# Patient Record
Sex: Male | Born: 1985
Health system: Southern US, Community
[De-identification: ages and names within clinical notes are randomized; demographics above are authoritative.]

## PROBLEM LIST (undated history)

## (undated) DIAGNOSIS — B360 Pityriasis versicolor: Secondary | ICD-10-CM

## (undated) DIAGNOSIS — R7303 Prediabetes: Secondary | ICD-10-CM

## (undated) DIAGNOSIS — K760 Fatty (change of) liver, not elsewhere classified: Secondary | ICD-10-CM

## (undated) DIAGNOSIS — E66813 Obesity, class 3: Secondary | ICD-10-CM

## (undated) DIAGNOSIS — L219 Seborrheic dermatitis, unspecified: Secondary | ICD-10-CM

## (undated) DIAGNOSIS — R748 Abnormal levels of other serum enzymes: Secondary | ICD-10-CM

## (undated) HISTORY — DX: Prediabetes: R73.03

## (undated) HISTORY — DX: Pityriasis versicolor: B36.0

## (undated) HISTORY — DX: Seborrheic dermatitis, unspecified: L21.9

## (undated) HISTORY — DX: Morbid (severe) obesity due to excess calories: E66.01

## (undated) HISTORY — DX: Obesity, class 3: E66.813

## (undated) HISTORY — DX: Abnormal levels of other serum enzymes: R74.8

## (undated) HISTORY — DX: Fatty (change of) liver, not elsewhere classified: K76.0

---

## 2010-03-18 HISTORY — PX: TOENAIL EXCISION: SUR558

## 2017-05-03 ENCOUNTER — Encounter: Payer: Self-pay | Admitting: Internal Medicine

## 2019-06-09 ENCOUNTER — Other Ambulatory Visit: Payer: Self-pay

## 2019-06-10 ENCOUNTER — Encounter: Payer: Self-pay | Admitting: Family Medicine

## 2019-06-10 ENCOUNTER — Ambulatory Visit: Payer: 59 | Admitting: Family Medicine

## 2019-06-10 VITALS — BP 129/82 | HR 95 | Temp 98.4°F | Resp 16 | Ht 65.5 in | Wt 272.0 lb

## 2019-06-10 DIAGNOSIS — Z0000 Encounter for general adult medical examination without abnormal findings: Secondary | ICD-10-CM | POA: Diagnosis not present

## 2019-06-10 DIAGNOSIS — Z131 Encounter for screening for diabetes mellitus: Secondary | ICD-10-CM

## 2019-06-10 DIAGNOSIS — R42 Dizziness and giddiness: Secondary | ICD-10-CM | POA: Diagnosis not present

## 2019-06-10 DIAGNOSIS — R7989 Other specified abnormal findings of blood chemistry: Secondary | ICD-10-CM | POA: Diagnosis not present

## 2019-06-10 DIAGNOSIS — Z13220 Encounter for screening for lipoid disorders: Secondary | ICD-10-CM

## 2019-06-10 NOTE — Progress Notes (Signed)
Office Note 06/10/2019  CC:  Chief Complaint  Patient presents with  . Establish Care    No recent/ Previous PCP, just moved to [place] in June   HPI:  Randall Chase is a 34 y.o.  male who is here to establish care Patient's most recent primary MD: none in the last 10 yrs. Old records were not reviewed prior to or during today's visit.  Says blood testing about 2 yrs ago was very elevated, started donating blood several times a year until the last 1 yr. Just donated last month.  Hemachromatosis was appropriately brought up by his MD,but pt did not f/u b/c of lack of insurance. No known FH of hemachromatosis. NO redness of face or neck.  No HAs.  About 2 wks ago he felt a brief period of dizziness and had acute craving for something to eat and drink.  No palpitations.  He did eat and drink and felt improved.  Duration of episode about 20 min.  No nausea.  Had not gone a long time since eating. He did have to pee.  No episode since that time.   Since then has been limiting sugar and coffee/caffeine. Some chronic nighttime inc frequency but o/w no polyuria.  No polydipsia. Eats regular meals.   Past Medical History:  Diagnosis Date  . Obesity, Class III, BMI 40-49.9 (morbid obesity) (HCC)   . Seborrheic dermatitis of scalp   . Tinea versicolor     Past Surgical History:  Procedure Laterality Date  . TOENAIL EXCISION  2012   Ingrown    Family History  Problem Relation Age of Onset  . High Cholesterol Mother   . Depression Father   . Diabetes Father   . Diabetes Maternal Grandmother   . Diabetes Maternal Grandfather   . Alcohol abuse Paternal Grandmother   . Diabetes Paternal Grandmother   . Diabetes Paternal Grandfather     Social History   Socioeconomic History  . Marital status: Single    Spouse name: Not on file  . Number of children: Not on file  . Years of education: Not on file  . Highest education level: Not on file  Occupational History  . Not on  file  Tobacco Use  . Smoking status: Never Smoker  . Smokeless tobacco: Never Used  Substance and Sexual Activity  . Alcohol use: Never  . Drug use: Never  . Sexual activity: Yes    Partners: Female    Birth control/protection: None  Other Topics Concern  . Not on file  Social History Narrative   Longtime girlfriend, 2 y/o daughter.   Relocated to [place] from [place], [place] [address].  Orig from [place].   College: Assoc degree in [place].   Occup: Data entry, asset recovery.   No T/A/Ds   Social Determinants of Health   Financial Resource Strain:   . Difficulty of Paying Living Expenses:   Food Insecurity:   . Worried About Programme researcher, broadcasting/film/video in the Last Year:   . Barista in the Last Year:   Transportation Needs:   . Freight forwarder (Medical):   Marland Kitchen Lack of Transportation (Non-Medical):   Physical Activity:   . Days of Exercise per Week:   . Minutes of Exercise per Session:   Stress:   . Feeling of Stress :   Social Connections:   . Frequency of Communication with Friends and Family:   . Frequency of Social Gatherings with Friends and Family:   .  Attends Religious Services:   . Active Member of Clubs or Organizations:   . Attends Archivist Meetings:   Marland Kitchen Marital Status:   Intimate Partner Violence:   . Fear of Current or Ex-Partner:   . Emotionally Abused:   Marland Kitchen Physically Abused:   . Sexually Abused:     Outpatient Encounter Medications as of 06/10/2019  Medication Sig  . COCONUT OIL PO Take by mouth every other day.  . ketoconazole (NIZORAL) 2 % shampoo USE 3 TIMES PER WEEK ALTERNATING WITH OVER THE COUNTER DANDRUFF SHAMPOO  . nystatin cream (MYCOSTATIN) APPLY TO AFFECTED AREA TWICE A DAY ON FACE AS NEEDED**MIX EQUAL PARTS WITH TRIAMCINOLONE CREAM**  . OREGANO PO Take by mouth every other day.  . triamcinolone cream (KENALOG) 0.1 % APPLY TO AFFECTED AREA ON FACE TWICE A DAY AS NEEDED **MIXED IN EQUAL PARTS WITH NYSTATIN**  . [DISCONTINUED]  fluconazole (DIFLUCAN) 200 MG tablet Take 200 mg by mouth every other day.   No facility-administered encounter medications on file as of 06/10/2019.    No Known Allergies  ROS Review of Systems  Constitutional: Negative for appetite change, chills, fatigue and fever.  HENT: Negative for congestion, dental problem, ear pain and sore throat.   Eyes: Negative for discharge, redness and visual disturbance.  Respiratory: Negative for cough, chest tightness, shortness of breath and wheezing.   Cardiovascular: Negative for chest pain, palpitations and leg swelling.  Gastrointestinal: Negative for abdominal pain, blood in stool, diarrhea, nausea and vomiting.  Endocrine: Negative for cold intolerance, heat intolerance, polydipsia, polyphagia and polyuria.  Genitourinary: Negative for difficulty urinating, dysuria, flank pain, frequency, hematuria and urgency.  Musculoskeletal: Negative for arthralgias, back pain, joint swelling, myalgias and neck stiffness.  Skin: Negative for pallor and rash.  Neurological: Positive for dizziness (one episode). Negative for speech difficulty, weakness and headaches.  Hematological: Negative for adenopathy. Does not bruise/bleed easily.  Psychiatric/Behavioral: Negative for confusion and sleep disturbance. The patient is not nervous/anxious.    PE; Blood pressure 129/82, pulse 95, temperature 98.4 F (36.9 C), temperature source Temporal, resp. rate 16, height 5' 5.5" (1.664 m), weight 272 lb (123.4 kg), SpO2 98 %. Gen: Alert, well appearing.  Patient is oriented to person, place, time, and situation. AFFECT: pleasant, lucid thought and speech. ENT: Ears: EACs clear, normal epithelium.  TMs with good light reflex and landmarks bilaterally.  Eyes: no injection, icteris, swelling, or exudate.  EOMI, PERRLA. Nose: no drainage or turbinate edema/swelling.  No injection or focal lesion.  Mouth: lips without lesion/swelling.  Oral mucosa pink and moist.  Dentition  intact and without obvious caries or gingival swelling.  Oropharynx without erythema, exudate, or swelling.  Neck: supple/nontender.  No LAD, mass, or TM.  Carotid pulses 2+ bilaterally, without bruits. CV: RRR, no m/r/g.   LUNGS: CTA bilat, nonlabored resps, good aeration in all lung fields. ABD: soft, NT, ND, BS normal.  No hepatospenomegaly or mass.  No bruits. EXT: no clubbing, cyanosis, or edema.  Musculoskeletal: no joint swelling, erythema, warmth, or tenderness.  ROM of all joints intact. Skin - no sores or suspicious lesions or rashes or color changes  Pertinent labs:  none  ASSESSMENT AND PLAN:   New pt; no old records available.  1) Hx of elevated ferritin; suspicion of hemachromatosis. He has been donating blood regularly but fell out of habit during 2020 covid crisis.  But most recent blood donation was last month.  Recheck CBC, iron panel today. May need referral to hem-onc.  2) Dizziness: isolated brief episode of what sounds like nonspecific panic attack sx's. No red flags for neurologic, cardiovascular, pulmonary, or endocrine etiology.  3) Health maintenance exam: Reviewed age and gender appropriate health maintenance issues (prudent diet, regular exercise, health risks of tobacco and excessive alcohol, use of seatbelts, fire alarms in home, use of sunscreen).  Also reviewed age and gender appropriate health screening as well as vaccine recommendations. Vaccines: Labs: fasting labs ordered --future FLP, CMET, TSH, CBC, iron panel FLP-screening for hyperlipidemia, obesity, dizziness, screening for DM 2). No vaccines today.   An After Visit Summary was printed and given to the patient.  Return for to be determined based on results of work up.  Signed:  Santiago Bumpers, MD           06/10/2019

## 2019-06-11 ENCOUNTER — Other Ambulatory Visit: Payer: Self-pay

## 2019-06-11 ENCOUNTER — Ambulatory Visit (INDEPENDENT_AMBULATORY_CARE_PROVIDER_SITE_OTHER): Payer: 59 | Admitting: Family Medicine

## 2019-06-11 DIAGNOSIS — R42 Dizziness and giddiness: Secondary | ICD-10-CM | POA: Diagnosis not present

## 2019-06-11 DIAGNOSIS — R7989 Other specified abnormal findings of blood chemistry: Secondary | ICD-10-CM | POA: Diagnosis not present

## 2019-06-11 DIAGNOSIS — Z13220 Encounter for screening for lipoid disorders: Secondary | ICD-10-CM

## 2019-06-11 DIAGNOSIS — Z131 Encounter for screening for diabetes mellitus: Secondary | ICD-10-CM | POA: Diagnosis not present

## 2019-06-11 LAB — CBC WITH DIFFERENTIAL/PLATELET
Basophils Absolute: 0 10*3/uL (ref 0.0–0.1)
Basophils Relative: 0.6 % (ref 0.0–3.0)
Eosinophils Absolute: 0.6 10*3/uL (ref 0.0–0.7)
Eosinophils Relative: 7.5 % — ABNORMAL HIGH (ref 0.0–5.0)
HCT: 44 % (ref 39.0–52.0)
Hemoglobin: 15.2 g/dL (ref 13.0–17.0)
Lymphocytes Relative: 50.1 % — ABNORMAL HIGH (ref 12.0–46.0)
Lymphs Abs: 4 10*3/uL (ref 0.7–4.0)
MCHC: 34.5 g/dL (ref 30.0–36.0)
MCV: 92.1 fl (ref 78.0–100.0)
Monocytes Absolute: 0.5 10*3/uL (ref 0.1–1.0)
Monocytes Relative: 6.8 % (ref 3.0–12.0)
Neutro Abs: 2.8 10*3/uL (ref 1.4–7.7)
Neutrophils Relative %: 35 % — ABNORMAL LOW (ref 43.0–77.0)
Platelets: 330 10*3/uL (ref 150.0–400.0)
RBC: 4.78 Mil/uL (ref 4.22–5.81)
RDW: 13.2 % (ref 11.5–15.5)
WBC: 7.9 10*3/uL (ref 4.0–10.5)

## 2019-06-11 LAB — COMPREHENSIVE METABOLIC PANEL
ALT: 75 U/L — ABNORMAL HIGH (ref 0–53)
AST: 38 U/L — ABNORMAL HIGH (ref 0–37)
Albumin: 4.3 g/dL (ref 3.5–5.2)
Alkaline Phosphatase: 93 U/L (ref 39–117)
BUN: 12 mg/dL (ref 6–23)
CO2: 26 mEq/L (ref 19–32)
Calcium: 9.4 mg/dL (ref 8.4–10.5)
Chloride: 103 mEq/L (ref 96–112)
Creatinine, Ser: 0.85 mg/dL (ref 0.40–1.50)
GFR: 103.15 mL/min (ref >60.00)
Glucose, Bld: 110 mg/dL — ABNORMAL HIGH (ref 70–99)
Potassium: 3.9 mEq/L (ref 3.5–5.1)
Sodium: 137 mEq/L (ref 135–145)
Total Bilirubin: 0.5 mg/dL (ref 0.2–1.2)
Total Protein: 7.6 g/dL (ref 6.0–8.3)

## 2019-06-11 LAB — LDL CHOLESTEROL, DIRECT: Direct LDL: 115 mg/dL

## 2019-06-11 LAB — LIPID PANEL
Cholesterol: 174 mg/dL (ref 0–200)
HDL: 31.9 mg/dL — ABNORMAL LOW (ref >39.00)
NonHDL: 141.65
Total CHOL/HDL Ratio: 5
Triglycerides: 235 mg/dL — ABNORMAL HIGH (ref 0.0–149.0)
VLDL: 47 mg/dL — ABNORMAL HIGH (ref 0.0–40.0)

## 2019-06-11 LAB — TSH: TSH: 3.53 u[IU]/mL (ref 0.35–4.50)

## 2019-06-12 LAB — IRON,TIBC AND FERRITIN PANEL
%SAT: 16 % (calc) — ABNORMAL LOW (ref 20–48)
Ferritin: 59 ng/mL (ref 38–380)
Iron: 55 ug/dL (ref 50–180)
TIBC: 340 mcg/dL (calc) (ref 250–425)

## 2019-06-15 ENCOUNTER — Other Ambulatory Visit (INDEPENDENT_AMBULATORY_CARE_PROVIDER_SITE_OTHER): Payer: 59

## 2019-06-15 ENCOUNTER — Other Ambulatory Visit: Payer: Self-pay | Admitting: Family Medicine

## 2019-06-15 DIAGNOSIS — R7301 Impaired fasting glucose: Secondary | ICD-10-CM

## 2019-06-15 DIAGNOSIS — R7401 Elevation of levels of liver transaminase levels: Secondary | ICD-10-CM

## 2019-06-15 LAB — HEMOGLOBIN A1C: Hgb A1c MFr Bld: 5.5 % (ref 4.6–6.5)

## 2019-06-16 LAB — HEPATITIS C ANTIBODY
Hepatitis C Ab: NONREACTIVE
SIGNAL TO CUT-OFF: 0.03 (ref <1.00)

## 2019-06-16 LAB — HEPATITIS B SURFACE ANTIGEN: Hepatitis B Surface Ag: NONREACTIVE

## 2019-06-17 ENCOUNTER — Other Ambulatory Visit: Payer: Self-pay

## 2019-06-17 DIAGNOSIS — D751 Secondary polycythemia: Secondary | ICD-10-CM

## 2019-06-18 ENCOUNTER — Telehealth: Payer: Self-pay | Admitting: Hematology

## 2019-06-18 NOTE — Telephone Encounter (Signed)
Received a new hem referral from Dr. Milinda Cave for erythrocytosis. Pt has been cld and scheduled to see Dr. Candise Che on 4/20 at 1pm. Pt aware to arrive 15 minutes early.

## 2019-07-02 ENCOUNTER — Telehealth: Payer: Self-pay | Admitting: Adult Health

## 2019-07-02 NOTE — Telephone Encounter (Signed)
Pt has been rescheduled to see Lillard Anes on 4/28 at 830am w/labs at 8am. Pt aware to arrive 15 minutes early.

## 2019-07-06 ENCOUNTER — Inpatient Hospital Stay: Payer: 59 | Admitting: Hematology

## 2019-07-06 ENCOUNTER — Inpatient Hospital Stay: Payer: 59

## 2019-07-13 NOTE — Progress Notes (Addendum)
Randall Chase  [phone] [phone]     ID: Randall Chase DOB: Dec 05, 1985  MR#: [id number]  FXT#:[id number]  Patient Care Team: Tammi Sou, MD as PCP - General (Family Medicine) Yvone Neu, MD as Consulting Physician (Dermatology) Scot Dock, NP OTHER MD:  CHIEF COMPLAINT: history elevated hemoglobin  CURRENT TREATMENT: undergoing evaluation   HISTORY OF CURRENT ILLNESS: Randall Chase tells [place] that his history began a couple of years ago when he was living in [place], [place].  He had a hemoglobin that was in the 20s and he saw a nutritionist, he remembers being told his iron was high as well.  He was recommended to donate blood regularly and he has been about every 3 months.  He has no records of this visit or lab values, however he notes that when he established care with Dr. Anitra Lauth he brought it up to him, and was recommended a hematology evaluation.  He has a slight elevation in his liver enzymes as well.  His recent hemoglobin and iron studies have remained normal.    Results for Randall Chase (MRN [id number]) as of 07/15/2019 09:34  Ref. Range 06/11/2019 08:36 07/14/2019 07:59  Hemoglobin Latest Ref Range: 13.0 - 17.0 g/dL 15.2 15.0  Results for Randall Chase (MRN [id number]) as of 07/15/2019 09:34  Ref. Range 06/11/2019 08:36 07/14/2019 07:59  Ferritin Latest Ref Range: 24 - 336 ng/mL 59 83    The patient's subsequent history is as detailed below.  INTERVAL HISTORY: Randall Chase is doing relatively well.  He hasn't donated blood in about 6 months.  He denies any new issues like headaches, fatigue, night sweats, unintentional weight loss, lymphadenopathy.    REVIEW OF SYSTEMS: Randall Chase notes that he is a never smoker.  He says that he snores and has snored for the past 10 years and his significant other has noted apneic episodes in his sleep.  His BMI is 46 today.  He works night shift frequently in asset recovery  and has a mild sleep pattern disturbance related to this.  He does not exercise, or follow a particular diet.  A detailed ROS was otherwise non contributory.    PAST MEDICAL HISTORY: Past Medical History:  Diagnosis Date  . Obesity, Class III, BMI 40-49.9 (morbid obesity) ([facility])   . Seborrheic dermatitis of scalp   . Tinea versicolor     PAST SURGICAL HISTORY: Past Surgical History:  Procedure Laterality Date  . TOENAIL EXCISION  2012   Ingrown    FAMILY HISTORY Family History  Problem Relation Age of Onset  . High Cholesterol Mother   . Depression Father   . Diabetes Father   . Diabetes Maternal Grandmother   . Diabetes Maternal Grandfather   . Alcohol abuse Paternal Grandmother   . Diabetes Paternal Grandmother   . Diabetes Paternal Grandfather     SOCIAL HISTORY: Lives with his significant other Mardene Celeste and his two year old daughter Mia in [place], [place].  He denies tobacco, ETOH, drug use.  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  He does not exercise.       ADVANCED DIRECTIVES: not in place   HEALTH MAINTENANCE: Social History   Tobacco Use  . Smoking status: Never Smoker  . Smokeless tobacco: Never Used  Substance Use Topics  . Alcohol use: Never  . Drug use: Never      No Known Allergies  Current Outpatient  Medications  Medication Sig Dispense Refill  . COCONUT OIL PO Take by mouth every other day.    . ketoconazole (NIZORAL) 2 % shampoo USE 3 TIMES PER WEEK ALTERNATING WITH OVER THE COUNTER DANDRUFF SHAMPOO    . nystatin cream (MYCOSTATIN) APPLY TO AFFECTED AREA TWICE A DAY ON FACE AS NEEDED**MIX EQUAL PARTS WITH TRIAMCINOLONE CREAM**    . OREGANO PO Take by mouth every other day.    . triamcinolone cream (KENALOG) 0.1 % APPLY TO AFFECTED AREA ON FACE TWICE A DAY AS NEEDED **MIXED IN EQUAL PARTS WITH NYSTATIN**     No current facility-administered medications for this visit.     OBJECTIVE:  Vitals:   07/14/19 0828  BP: (!) 148/74  Pulse: 99  Resp: 18  Temp: 98.2 F (36.8 C)  SpO2: 100%     Body mass index is 46.2 kg/m.   Wt Readings from Last 3 Encounters:  07/14/19 277 lb 9.6 oz (125.9 kg)  06/10/19 272 lb (123.4 kg)  ECOG FS:0 - Asymptomatic GENERAL: Patient is a well appearing morbidly obese male in no acute distress HEENT:  Sclerae anicteric.  Mask in place Neck is supple.  NODES:  No cervical, supraclavicular, or axillary lymphadenopathy palpated.  LUNGS:  Clear to auscultation bilaterally.  No wheezes or rhonchi. HEART:  Regular rate and rhythm. No murmur appreciated. ABDOMEN:  Soft, nontender.  Positive, normoactive bowel sounds. No organomegaly palpated. MSK:  No focal spinal tenderness to palpation. EXTREMITIES:  No peripheral edema.   SKIN:  Clear with no obvious rashes or skin changes. NEURO:  Nonfocal. Well oriented.  Appropriate affect.    LAB RESULTS:  CMP     Component Value Date/Time   NA 140 07/14/2019 0759   K 4.3 07/14/2019 0759   CL 107 07/14/2019 0759   CO2 25 07/14/2019 0759   GLUCOSE 136 (H) 07/14/2019 0759   BUN 9 07/14/2019 0759   CREATININE 0.90 07/14/2019 0759   CALCIUM 9.4 07/14/2019 0759   PROT 8.0 07/14/2019 0759   ALBUMIN 3.9 07/14/2019 0759   AST 61 (H) 07/14/2019 0759   ALT 105 (H) 07/14/2019 0759   ALKPHOS 109 07/14/2019 0759   BILITOT 0.4 07/14/2019 0759   GFRNONAA >60 07/14/2019 0759   GFRAA >60 07/14/2019 0759    No results found for: Ronnald Ramp, A1GS, A2GS, BETS, BETA2SER, GAMS, MSPIKE, SPEI  No results found for: Nils Pyle, [facility]  Lab Results  Component Value Date   WBC 7.3 07/14/2019   NEUTROABS 3.5 07/14/2019   HGB 15.0 07/14/2019   HCT 42.4 07/14/2019   MCV 87.8 07/14/2019   PLT 327 07/14/2019      Chemistry      Component Value Date/Time   NA 140 07/14/2019 0759   K 4.3 07/14/2019 0759   CL 107 07/14/2019 0759   CO2 25 07/14/2019 0759    BUN 9 07/14/2019 0759   CREATININE 0.90 07/14/2019 0759      Component Value Date/Time   CALCIUM 9.4 07/14/2019 0759   ALKPHOS 109 07/14/2019 0759   AST 61 (H) 07/14/2019 0759   ALT 105 (H) 07/14/2019 0759   BILITOT 0.4 07/14/2019 0759       No results found for: LABCA2  No components found for: LOVFIE332  No results for input(s): INR in the last 168 hours.  No results found for: LABCA2  No results found for: RJJ884  No results found for: ZYS063  No results found for: KZS010  No results found for: XN2355  No components found for: HGQUANT  No results found for: CEA1 / No results found for: CEA1   No results found for: AFPTUMOR  No results found for: CHROMOGRNA  No results found for: PSA1  Appointment on 07/14/2019  Component Date Value Ref Range Status  . Retic Ct Pct 07/14/2019 2.1  0.4 - 3.1 % Final  . RBC. 07/14/2019 4.85  4.22 - 5.81 MIL/uL Final  . Retic Count, Absolute 07/14/2019 102.8  19.0 - 186.0 K/uL Final  . Immature Retic Fract 07/14/2019 10.2  2.3 - 15.9 % Final   Performed at [facility], [place] [address], [place], [place] [address]  . Smear Review 07/14/2019 SMEAR STAINED AND AVAILABLE FOR REVIEW   Final   Performed at [facility], [address], [place], [place] [address]  . Ferritin 07/14/2019 83  24 - 336 ng/mL Final   Performed at [facility], [place] [address], [place], [place] [address]  . Iron 07/14/2019 58  42 - 163 ug/dL Final  . TIBC 07/14/2019 334  202 - 409 ug/dL Final  . Saturation Ratios 07/14/2019 17* 20 - 55 % Final  . UIBC 07/14/2019 275  117 - 376 ug/dL Final   Performed at [facility], [place] [address], [place],  [address]  . Sodium 07/14/2019 140  135 - 145 mmol/L Final  . Potassium 07/14/2019 4.3  3.5 - 5.1 mmol/L Final  . Chloride 07/14/2019 107  98 - 111 mmol/L Final  . CO2 07/14/2019 25  22 - 32 mmol/L Final  .  Glucose, Bld 07/14/2019 136* 70 - 99 mg/dL Final   Glucose reference range applies only to samples taken after fasting for at least 8 hours.  . BUN 07/14/2019 9  6 - 20 mg/dL Final  . Creatinine 07/14/2019 0.90  0.61 - 1.24 mg/dL Final  . Calcium 07/14/2019 9.4  8.9 - 10.3 mg/dL Final  . Total Protein 07/14/2019 8.0  6.5 - 8.1 g/dL Final  . Albumin 07/14/2019 3.9  3.5 - 5.0 g/dL Final  . AST 07/14/2019 61* 15 - 41 U/L Final  . ALT 07/14/2019 105* 0 - 44 U/L Final  . Alkaline Phosphatase 07/14/2019 109  38 - 126 U/L Final  . Total Bilirubin 07/14/2019 0.4  0.3 - 1.2 mg/dL Final  . GFR, Est Non Af Am 07/14/2019 >60  >60 mL/min Final  . GFR, Est AFR Am 07/14/2019 >60  >60 mL/min Final  . Anion gap 07/14/2019 8  5 - 15 Final   Performed at [facility], [place] [address], [place],  [address]  . WBC Count 07/14/2019 7.3  4.0 - 10.5 K/uL Final  . RBC 07/14/2019 4.83  4.22 - 5.81 MIL/uL Final  . Hemoglobin 07/14/2019 15.0  13.0 - 17.0 g/dL Final  . HCT 07/14/2019 42.4  39.0 - 52.0 % Final  . MCV 07/14/2019 87.8  80.0 - 100.0 fL Final  . MCH 07/14/2019 31.1  26.0 - 34.0 pg Final  . MCHC 07/14/2019 35.4  30.0 - 36.0 g/dL Final  . RDW 07/14/2019 12.1  11.5 - 15.5 % Final  . Platelet Count 07/14/2019 327  150 - 400 K/uL Final  . nRBC 07/14/2019 0.0  0.0 - 0.2 % Final  . Neutrophils Relative % 07/14/2019 48  % Final  . Neutro Abs 07/14/2019 3.5  1.7 - 7.7 K/uL Final  . Lymphocytes Relative 07/14/2019 37  % Final  .  Lymphs Abs 07/14/2019 2.7  0.7 - 4.0 K/uL Final  . Monocytes Relative 07/14/2019 8  % Final  . Monocytes Absolute 07/14/2019 0.6  0.1 - 1.0 K/uL Final  . Eosinophils Relative 07/14/2019 6  % Final  . Eosinophils Absolute 07/14/2019 0.4  0.0 - 0.5 K/uL Final  . Basophils Relative 07/14/2019 1  % Final  . Basophils Absolute 07/14/2019 0.1  0.0 - 0.1 K/uL Final  . Immature Granulocytes 07/14/2019 0  % Final  . Abs Immature Granulocytes 07/14/2019 0.03   0.00 - 0.07 K/uL Final   Performed at [facility], [place] [address], [place], [place] [address]  . Erythropoietin 07/14/2019 10.4  2.6 - 18.5 mIU/mL Final   Comment: (NOTE) Beckman [facility]ter Uni[employer] DxI [place] obtained with different assay methods or kits cannot be used interchangeably. Results cannot be interpreted as absolute evidence of the presence or absence of malignant disease. Performed At: [facility] [place], [place] [id number] Rush Farmer MD [phone]     (this displays the last labs from the last 3 days)  No results found for: TOTALPROTELP, ALBUMINELP, A1GS, A2GS, BETS, BETA2SER, GAMS, MSPIKE, SPEI (this displays SPEP labs)  No results found for: KPAFRELGTCHN, LAMBDASER, KAPLAMBRATIO (kappa/lambda light chains)  No results found for: HGBA, HGBA2QUANT, HGBFQUANT, HGBSQUAN (Hemoglobinopathy evaluation)   No results found for: LDH  Lab Results  Component Value Date   IRON 58 07/14/2019   TIBC 334 07/14/2019   IRONPCTSAT 17 (L) 07/14/2019   (Iron and TIBC)  Lab Results  Component Value Date   FERRITIN 83 07/14/2019    Urinalysis No results found for: COLORURINE, APPEARANCEUR, LABSPEC, PHURINE, GLUCOSEU, HGBUR, BILIRUBINUR, KETONESUR, PROTEINUR, UROBILINOGEN, NITRITE, LEUKOCYTESUR   STUDIES: No results found.  ELIGIBLE FOR AVAILABLE RESEARCH PROTOCOL:   ASSESSMENT: 34 y.o. male with history of elevated hemoglobin/iron per his report  1) Polycythemia  (a) history highly suspicious for sleep apnea, recommended sleep study and weight loss  (b) EPO level pending  (c) awaiting lab studies from 2 years ago from patient, will get Jak 2 depending on those  2) Elevated Iron  (a) awaiting lab studies from 2 years ago, will get panel depending on those  PLAN: Randall Chase is doing well today.  He met with myself and Dr. Jana Hakim to review his preliminary labs and a plan.    We reviewed  that there are three main types of blood cells that are made in the bone marrow.    1.  White blood cells, or immune cells.  His are normal in number and appearance.  2. Platelets or clotting cells.  His are normal in number and appearance.  3. Red blood cells, or oxygen carrying cells.  Per his history, these have been elevated in the past, however today his are normal in number and appearance.    Too many red blood cells is called polycythemia.  There are two different types of polycythemia.  The first type is primary polycythemia.  This is secondary to an acquired mutation on the blood cell that causes over production.  Secondary polycythemia is related to a decrease in oxygen.  We explained that there is an oxygen sensor in the kidneys and if they detect decreased oxygen they make EPO to stimulate the red blood cells to produce more red blood cells.  Patients have decreased oxygen when they live at higher altitudes, smoke cigarettes, or have oxygen issues such as sleep apnea.    Right now,  Randall Chase has untreated sleep apnea, which may very well be a cause of his elevated red blood cells.  I recommended he get back in with his PCP to get a sleep study arranged.  I also recommended that he lose weight.  I gave him information for our weight loss clinic and Dr. Redgie Grayer so he can look into getting an appointment and getting on a regimen.  I recommended healthy diet and exercise in the interim.    It is difficult to determine what exactly the labs were from [place].  My care everywhere check was inconclusive.  Jasten has the lab results at home and he will take a picture and send them to me through my chart.  Once we have the EPO results and his lab results, we will decide if JAK2 and hemochromatosis panel is indicated.    Based on the above will dictate whether or not we need to f/u with Lee.  He was given an AVS and instructions on how to access my chart.  He knows to call if he needs  anything in the interim.  Total encounter time: 60 minutes*   Wilber Bihari, NP   07/15/2019 9:40 AM Medical Oncology and Hematology [facility] [address] [place], [place] [address] Tel. [phone]    Fax. [phone]   ADDENDUM: 34 year old [place] man with 2 separate issues.  The first is erythrocytosis.  He understands the difference between having too many red cells as a result of low oxygen, which is a reactive and benign process, and having too many red cells as a result of a genomic change in his Red cell precursors, polycythemia vera, which is of more concern.  We obtained an erythropoietin level, which is normal.  This plus the patient's sleep apnea and morbid obesity suggest that we have a reactive process.  We are sending a JAK2 just to confirm.  The treatment for this problem of course is weight loss and we discussed that in an initial weight today.  The patient tells [place] that he has a history of hemochromatosis.  It is not clear whether the genetics of this have ever been confirmed.  He did bring some lab work from [place] which showed a ferritin in the 300 or so range.  Of course the ferritin is an acute phase reactant.  I think it would be a good idea also to check him for the main hemochromatosis gene mutation and we can obtain that at the same time as the JAK2.  He will then return to see me in June and I expect to have all results by then.  In the meantime he has been donating blood on a regular basis and this is something we encouraged him to continue.  I personally saw this patient and performed a substantive portion of this encounter with the listed APP documented above.   Chauncey Cruel, MD Medical Oncology and Hematology [facility] [address] [place], [place] [address] Tel. [phone]    Fax. [phone]   *Total Encounter Time as defined by the Centers for Medicare and Medicaid Services includes, in addition  to the face-to-face time of a patient visit (documented in the note above) non-face-to-face time: obtaining and reviewing outside history, ordering and reviewing medications, tests or procedures, care coordination (communications with other health care professionals or caregivers) and documentation in the medical record.

## 2019-07-14 ENCOUNTER — Inpatient Hospital Stay: Payer: 59

## 2019-07-14 ENCOUNTER — Inpatient Hospital Stay: Payer: 59 | Attending: Adult Health | Admitting: Adult Health

## 2019-07-14 ENCOUNTER — Encounter: Payer: Self-pay | Admitting: Adult Health

## 2019-07-14 ENCOUNTER — Other Ambulatory Visit: Payer: Self-pay

## 2019-07-14 VITALS — BP 148/74 | HR 99 | Temp 98.2°F | Resp 18 | Ht 65.0 in | Wt 277.6 lb

## 2019-07-14 DIAGNOSIS — D751 Secondary polycythemia: Secondary | ICD-10-CM | POA: Diagnosis not present

## 2019-07-14 DIAGNOSIS — R7989 Other specified abnormal findings of blood chemistry: Secondary | ICD-10-CM | POA: Diagnosis not present

## 2019-07-14 DIAGNOSIS — G4730 Sleep apnea, unspecified: Secondary | ICD-10-CM | POA: Insufficient documentation

## 2019-07-14 DIAGNOSIS — Z79899 Other long term (current) drug therapy: Secondary | ICD-10-CM | POA: Diagnosis not present

## 2019-07-14 LAB — CBC WITH DIFFERENTIAL (CANCER CENTER ONLY)
Abs Immature Granulocytes: 0.03 10*3/uL (ref 0.00–0.07)
Basophils Absolute: 0.1 10*3/uL (ref 0.0–0.1)
Basophils Relative: 1 %
Eosinophils Absolute: 0.4 10*3/uL (ref 0.0–0.5)
Eosinophils Relative: 6 %
HCT: 42.4 % (ref 39.0–52.0)
Hemoglobin: 15 g/dL (ref 13.0–17.0)
Immature Granulocytes: 0 %
Lymphocytes Relative: 37 %
Lymphs Abs: 2.7 10*3/uL (ref 0.7–4.0)
MCH: 31.1 pg (ref 26.0–34.0)
MCHC: 35.4 g/dL (ref 30.0–36.0)
MCV: 87.8 fL (ref 80.0–100.0)
Monocytes Absolute: 0.6 10*3/uL (ref 0.1–1.0)
Monocytes Relative: 8 %
Neutro Abs: 3.5 10*3/uL (ref 1.7–7.7)
Neutrophils Relative %: 48 %
Platelet Count: 327 10*3/uL (ref 150–400)
RBC: 4.83 MIL/uL (ref 4.22–5.81)
RDW: 12.1 % (ref 11.5–15.5)
WBC Count: 7.3 10*3/uL (ref 4.0–10.5)
nRBC: 0 % (ref 0.0–0.2)

## 2019-07-14 LAB — CMP (CANCER CENTER ONLY)
ALT: 105 U/L — ABNORMAL HIGH (ref 0–44)
AST: 61 U/L — ABNORMAL HIGH (ref 15–41)
Albumin: 3.9 g/dL (ref 3.5–5.0)
Alkaline Phosphatase: 109 U/L (ref 38–126)
Anion gap: 8 (ref 5–15)
BUN: 9 mg/dL (ref 6–20)
CO2: 25 mmol/L (ref 22–32)
Calcium: 9.4 mg/dL (ref 8.9–10.3)
Chloride: 107 mmol/L (ref 98–111)
Creatinine: 0.9 mg/dL (ref 0.61–1.24)
GFR, Est AFR Am: >60 mL/min (ref >60)
GFR, Estimated: >60 mL/min (ref >60)
Glucose, Bld: 136 mg/dL — ABNORMAL HIGH (ref 70–99)
Potassium: 4.3 mmol/L (ref 3.5–5.1)
Sodium: 140 mmol/L (ref 135–145)
Total Bilirubin: 0.4 mg/dL (ref 0.3–1.2)
Total Protein: 8 g/dL (ref 6.5–8.1)

## 2019-07-14 LAB — RETICULOCYTES
Immature Retic Fract: 10.2 % (ref 2.3–15.9)
RBC.: 4.85 MIL/uL (ref 4.22–5.81)
Retic Count, Absolute: 102.8 10*3/uL (ref 19.0–186.0)
Retic Ct Pct: 2.1 % (ref 0.4–3.1)

## 2019-07-14 LAB — IRON AND TIBC
Iron: 58 ug/dL (ref 42–163)
Saturation Ratios: 17 % — ABNORMAL LOW (ref 20–55)
TIBC: 334 ug/dL (ref 202–409)
UIBC: 275 ug/dL (ref 117–376)

## 2019-07-14 LAB — SAVE SMEAR(SSMR), FOR PROVIDER SLIDE REVIEW

## 2019-07-14 LAB — FERRITIN: Ferritin: 83 ng/mL (ref 24–336)

## 2019-07-15 ENCOUNTER — Telehealth: Payer: Self-pay | Admitting: Adult Health

## 2019-07-15 ENCOUNTER — Encounter: Payer: Self-pay | Admitting: Adult Health

## 2019-07-15 LAB — ERYTHROPOIETIN: Erythropoietin: 10.4 m[IU]/mL (ref 2.6–18.5)

## 2019-07-15 NOTE — Telephone Encounter (Signed)
No 4/29 los. No changes made to pt's schedule.  

## 2019-07-16 ENCOUNTER — Other Ambulatory Visit: Payer: Self-pay | Admitting: Adult Health

## 2019-07-16 ENCOUNTER — Telehealth: Payer: Self-pay | Admitting: Adult Health

## 2019-07-16 DIAGNOSIS — R7989 Other specified abnormal findings of blood chemistry: Secondary | ICD-10-CM

## 2019-07-16 NOTE — Progress Notes (Signed)
I received Tranell's labs from 2019 via my chart.  His ferritin at that time was 354.  His hemoglobin was 15.9.  We will get hereditary hemochromatosis work up, and no jak2.  He has been phlebotomizing since that time with blood donations, and that explains how his ferritin is in a more reasonable level.  We will see him back a couple of weeks after his lab appointment.  Lillard Anes, NP

## 2019-07-16 NOTE — Telephone Encounter (Signed)
Scheduled appt per 4/30 sch message - unable to reach pt . Left message with appt dates and times

## 2019-07-23 ENCOUNTER — Inpatient Hospital Stay: Payer: 59

## 2019-07-29 ENCOUNTER — Ambulatory Visit (INDEPENDENT_AMBULATORY_CARE_PROVIDER_SITE_OTHER): Payer: 59 | Admitting: Family Medicine

## 2019-07-29 ENCOUNTER — Other Ambulatory Visit: Payer: Self-pay

## 2019-07-29 ENCOUNTER — Encounter (INDEPENDENT_AMBULATORY_CARE_PROVIDER_SITE_OTHER): Payer: Self-pay | Admitting: Family Medicine

## 2019-07-29 VITALS — BP 128/80 | HR 96 | Temp 97.9°F | Ht 67.0 in | Wt 273.0 lb

## 2019-07-29 DIAGNOSIS — Z1331 Encounter for screening for depression: Secondary | ICD-10-CM

## 2019-07-29 DIAGNOSIS — R0683 Snoring: Secondary | ICD-10-CM

## 2019-07-29 DIAGNOSIS — R0602 Shortness of breath: Secondary | ICD-10-CM

## 2019-07-29 DIAGNOSIS — Z6841 Body Mass Index (BMI) 40.0 and over, adult: Secondary | ICD-10-CM

## 2019-07-29 DIAGNOSIS — R5383 Other fatigue: Secondary | ICD-10-CM

## 2019-07-29 DIAGNOSIS — R7989 Other specified abnormal findings of blood chemistry: Secondary | ICD-10-CM

## 2019-07-29 DIAGNOSIS — Z9189 Other specified personal risk factors, not elsewhere classified: Secondary | ICD-10-CM

## 2019-07-29 DIAGNOSIS — R7301 Impaired fasting glucose: Secondary | ICD-10-CM

## 2019-07-29 DIAGNOSIS — Z0289 Encounter for other administrative examinations: Secondary | ICD-10-CM

## 2019-07-29 NOTE — Progress Notes (Signed)
Chief Complaint:   OBESITY Randall Chase (MR# [id number]) is a 34 y.o. male who presents for evaluation and treatment of obesity and related comorbidities. Current BMI is Body mass index is 42.76 kg/m. Randall Chase has been struggling with his weight for many years and has been unsuccessful in either losing weight, maintaining weight loss, or reaching his healthy weight goal.  Randall Chase is currently in the action stage of change and ready to dedicate time achieving and maintaining a healthier weight. Randall Chase is interested in becoming our patient and working on intensive lifestyle modifications including (but not limited to) diet and exercise for weight loss.  Randall Chase was referred by Hematology.  He works 60-80 hours per week.  Randall Chase's habits were reviewed today and are as follows: His family eats meals together, he thinks his family will eat healthier with him, his desired weight loss is 77 pounds, he has been heavy most of his life, he started gaining weight within the last 2 years, his heaviest weight ever was 277 pounds, he is a picky eater, he craves pizza and steak, he snacks frequently in the evenings, he wakes up frequently in the middle of the night to eat, he skips breakfast frequently, he is frequently drinking liquids with calories, he frequently makes poor food choices and he struggles with emotional eating.  Depression Screen Randall Chase's Food and Mood (modified PHQ-9) score was 14.  Depression screen PHQ 2/9 07/29/2019  Decreased Interest 2  Down, Depressed, Hopeless 1  PHQ - 2 Score 3  Altered sleeping 3  Tired, decreased energy 2  Change in appetite 1  Feeling bad or failure about yourself  2  Trouble concentrating 2  Moving slowly or fidgety/restless 1  Suicidal thoughts 0  PHQ-9 Score 14  Difficult doing work/chores Not difficult at all   Subjective:   1. Other fatigue Randall Chase denies daytime somnolence and reports waking up still tired. Patent has a  history of symptoms of morning fatigue and snoring. Randall Chase generally gets 8 hours of sleep per night, and states that he has generally restful sleep. Snoring is present. Apneic episodes are present. Epworth Sleepiness Score is 9.  2. SOB (shortness of breath) on exertion Brain notes increasing shortness of breath with exercising and seems to be worsening over time with weight gain. He notes getting out of breath sooner with activity than he used to. This has gotten worse recently. Randall Chase denies shortness of breath at rest or orthopnea.  3. Elevated LFTs Randall Chase has a new dx of elevated ALT. His BMI is over 40. He denies abdominal pain or jaundice and has never been told of any liver problems in the past. He denies excessive alcohol intake.  Lab Results  Component Value Date   ALT 105 (H) 07/14/2019   AST 61 (H) 07/14/2019   ALKPHOS 109 07/14/2019   BILITOT 0.4 07/14/2019   4. Fasting hyperglycemia Randall Chase has a history of some elevated blood glucose readings without a diagnosis of diabetes. He denies polyphagia.  5. Snoring Situation Randall Chase of Dozing or Sleeping  Sitting and reading 1 = slight Randall Chase of dozing or sleeping  Watching television 1 = slight Randall Chase of dozing or sleeping  Sitting as a passenger in a car for an hour 2 = moderate Randall Chase of dozing or sleeping  Sitting inactive in a public place (theater or meeting) 1 = slight Randall Chase of dozing or sleeping  Lying down in the afternoon when circumstances permit 3 = high Randall Chase of dozing or sleeping  Sitting and talking to someone 0 = would never doze or sleep  Sitting quietly after lunch without alcohol 0 = would never doze or sleep  In a car, while stopped for a few minutes in traffic 1 = slight Randall Chase of dozing or sleeping  TOTAL 9   6. Depression screening Randall Chase was screened for depression as part of his new patient paperwork.  7. At risk for obstructive sleep apnea Randall Chase is at increased risk for sleep  apnea due to obesity.  The patient endorses the following symptoms: snoring and apneic events. Epworth score: 9.  Assessment/Plan:   1. Other fatigue Randall Chase does feel that his weight is causing his energy to be lower than it should be. Fatigue may be related to obesity, depression or many other causes. Labs will be ordered, and in the meanwhile, Randall Chase will focus on self care including making healthy food choices, increasing physical activity and focusing on stress reduction.  Orders - EKG 12-Lead - VITAMIN D 25 Hydroxy (Vit-D Deficiency, Fractures)  2. SOB (shortness of breath) on exertion Randall Chase does feel that he gets out of breath more easily that he used to when he exercises. Randall Chase's shortness of breath appears to be obesity related and exercise induced. He has agreed to work on weight loss and gradually increase exercise to treat his exercise induced shortness of breath. Will continue to monitor closely.  3. Elevated LFTs We discussed the likely diagnosis of non-alcoholic fatty liver disease today and how this condition is obesity related. Randall Chase was educated the importance of weight loss. Randall Chase agreed to continue with his weight loss efforts with healthier diet and exercise as an essential part of his treatment plan.  4. Fasting hyperglycemia Fasting labs will be obtained and results with be discussed with Randall Chase in 2 weeks at his follow up visit. In the meanwhile Randall Chase was started on a lower simple carbohydrate diet and will work on weight loss efforts.  Orders - Comprehensive metabolic panel - Insulin, random  5. Snoring Will refer Randall Chase to Sleep Medicine for evaluation for OSA.  Orders - Ambulatory referral to Neurology  6. Depression screening Randall Chase had a positive depression screening. Depression is commonly associated with obesity and often results in emotional eating behaviors. We will monitor this closely and work on CBT to help improve  the non-hunger eating patterns. Referral to Psychology may be required if no improvement is seen as he continues in our clinic.  7. At risk for obstructive sleep apnea Randall Chase was given approximately 15 minutes of coronary artery disease prevention counseling today. He is 34 y.o. male and has risk factors for obstructive sleep apnea including obesity. We discussed intensive lifestyle modifications today with an emphasis on specific weight loss instructions and strategies.  Repetitive spaced learning was employed today to elicit superior memory formation and behavioral change.  8. Class 3 severe obesity with serious comorbidity and body mass index (BMI) of 40.0 to 44.9 in adult, unspecified obesity type (Randall Chase) Randall Chase is currently in the action stage of change and his goal is to continue with weight loss efforts. I recommend Randall Chase begin the structured treatment plan as follows:  He has agreed to the Category 4 Plan.  Exercise goals: No exercise has been prescribed at this time.   Behavioral modification strategies: increasing lean protein intake, decreasing simple carbohydrates, increasing vegetables, increasing water intake and decreasing liquid calories.  He was informed of the importance of frequent follow-up visits to maximize his success with intensive lifestyle modifications for  his multiple health conditions. He was informed we would discuss his lab results at his next visit unless there is a critical issue that needs to be addressed sooner. Randall Chase agreed to keep his next visit at the agreed upon time to discuss these results.  Objective:   Blood pressure 128/80, pulse 96, temperature 97.9 F (36.6 C), temperature source Oral, height 5\' 7"  (1.702 m), weight 273 lb (123.8 kg), SpO2 97 %. Body mass index is 42.76 kg/m.  EKG: Normal sinus rhythm, rate 95 bpm.  Indirect Calorimeter completed today shows a VO2 of 388 and a REE of 2698.  His calculated basal metabolic rate is 1962  thus his basal metabolic rate is better than expected.  General: Cooperative, alert, well developed, in no acute distress. HEENT: Conjunctivae and lids unremarkable. Cardiovascular: Regular rhythm.  Lungs: Normal work of breathing. Neurologic: No focal deficits.   Lab Results  Component Value Date   CREATININE 0.90 07/14/2019   BUN 9 07/14/2019   NA 140 07/14/2019   K 4.3 07/14/2019   CL 107 07/14/2019   CO2 25 07/14/2019   Lab Results  Component Value Date   ALT 105 (H) 07/14/2019   AST 61 (H) 07/14/2019   ALKPHOS 109 07/14/2019   BILITOT 0.4 07/14/2019   Lab Results  Component Value Date   HGBA1C 5.5 06/15/2019   Lab Results  Component Value Date   TSH 3.53 06/11/2019   Lab Results  Component Value Date   CHOL 174 06/11/2019   HDL 31.90 (L) 06/11/2019   LDLDIRECT 115.0 06/11/2019   TRIG 235.0 (H) 06/11/2019   CHOLHDL 5 06/11/2019   Lab Results  Component Value Date   WBC 7.3 07/14/2019   HGB 15.0 07/14/2019   HCT 42.4 07/14/2019   MCV 87.8 07/14/2019   PLT 327 07/14/2019   Lab Results  Component Value Date   IRON 58 07/14/2019   TIBC 334 07/14/2019   FERRITIN 83 07/14/2019   Attestation Statements:   This is the patient's first visit at [facility]. The patient's NEW PATIENT PACKET was reviewed at length. Included in the packet: current and past health history, medications, allergies, ROS, gynecologic history (women only), surgical history, family history, social history, weight history, weight loss surgery history (for those that have had weight loss surgery), nutritional evaluation, mood and food questionnaire, PHQ9, Epworth questionnaire, sleep habits questionnaire, patient life and health improvement goals questionnaire. These will all be scanned into the patient's chart under media.   During the visit, I independently reviewed the patient's EKG, bioimpedance scale results, and indirect calorimeter results. I used this information to  tailor a meal plan for the patient that will help him to lose weight and will improve his obesity-related conditions going forward. I performed a medically necessary appropriate examination and/or evaluation. I discussed the assessment and treatment plan with the patient. The patient was provided an opportunity to ask questions and all were answered. The patient agreed with the plan and demonstrated an understanding of the instructions. Labs were ordered at this visit and will be reviewed at the next visit unless more critical results need to be addressed immediately. Clinical information was updated and documented in the EMR.   I, Water quality scientist, CMA, am acting as Location manager for [employer], DO.  I have reviewed the above documentation for accuracy and completeness, and I agree with the above. Briscoe Deutscher, DO

## 2019-07-30 LAB — COMPREHENSIVE METABOLIC PANEL
ALT: 96 IU/L — ABNORMAL HIGH (ref 0–44)
AST: 59 IU/L — ABNORMAL HIGH (ref 0–40)
Albumin/Globulin Ratio: 1.3 (ref 1.2–2.2)
Albumin: 4.6 g/dL (ref 4.0–5.0)
Alkaline Phosphatase: 118 IU/L — ABNORMAL HIGH (ref 39–117)
BUN/Creatinine Ratio: 13 (ref 9–20)
BUN: 10 mg/dL (ref 6–20)
Bilirubin Total: 0.3 mg/dL (ref 0.0–1.2)
CO2: 22 mmol/L (ref 20–29)
Calcium: 9.7 mg/dL (ref 8.7–10.2)
Chloride: 102 mmol/L (ref 96–106)
Creatinine, Ser: 0.75 mg/dL — ABNORMAL LOW (ref 0.76–1.27)
GFR calc Af Amer: 138 mL/min/{1.73_m2} (ref >59)
GFR calc non Af Amer: 120 mL/min/{1.73_m2} (ref >59)
Globulin, Total: 3.5 g/dL (ref 1.5–4.5)
Glucose: 129 mg/dL — ABNORMAL HIGH (ref 65–99)
Potassium: 4 mmol/L (ref 3.5–5.2)
Sodium: 139 mmol/L (ref 134–144)
Total Protein: 8.1 g/dL (ref 6.0–8.5)

## 2019-07-30 LAB — VITAMIN D 25 HYDROXY (VIT D DEFICIENCY, FRACTURES): Vit D, 25-Hydroxy: 18.9 ng/mL — ABNORMAL LOW (ref 30.0–100.0)

## 2019-07-30 LAB — INSULIN, RANDOM: INSULIN: 351 u[IU]/mL — ABNORMAL HIGH (ref 2.6–24.9)

## 2019-08-12 ENCOUNTER — Other Ambulatory Visit: Payer: Self-pay

## 2019-08-12 ENCOUNTER — Encounter (INDEPENDENT_AMBULATORY_CARE_PROVIDER_SITE_OTHER): Payer: Self-pay | Admitting: Family Medicine

## 2019-08-12 ENCOUNTER — Ambulatory Visit (INDEPENDENT_AMBULATORY_CARE_PROVIDER_SITE_OTHER): Payer: 59 | Admitting: Family Medicine

## 2019-08-12 VITALS — BP 131/81 | HR 95 | Temp 98.4°F | Ht 67.0 in | Wt 275.0 lb

## 2019-08-12 DIAGNOSIS — R7989 Other specified abnormal findings of blood chemistry: Secondary | ICD-10-CM | POA: Diagnosis not present

## 2019-08-12 DIAGNOSIS — Z9189 Other specified personal risk factors, not elsewhere classified: Secondary | ICD-10-CM | POA: Diagnosis not present

## 2019-08-12 DIAGNOSIS — R7303 Prediabetes: Secondary | ICD-10-CM | POA: Diagnosis not present

## 2019-08-12 DIAGNOSIS — E559 Vitamin D deficiency, unspecified: Secondary | ICD-10-CM

## 2019-08-12 DIAGNOSIS — Z6841 Body Mass Index (BMI) 40.0 and over, adult: Secondary | ICD-10-CM

## 2019-08-12 MED ORDER — TRULICITY 0.75 MG/0.5ML ~~LOC~~ SOAJ: 0.7500 mg | SUBCUTANEOUS | 0 refills | Status: DC

## 2019-08-12 MED ORDER — VITAMIN D (ERGOCALCIFEROL) 1.25 MG (50000 UNIT) PO CAPS: 50000.0000 [IU] | ORAL_CAPSULE | ORAL | 0 refills | Status: DC

## 2019-08-12 NOTE — Progress Notes (Signed)
Chief Complaint:   OBESITY Randall Chase is here to discuss his progress with his obesity treatment plan along with follow-up of his obesity related diagnoses. Randall Chase is on the Category 4 Plan and states he is following his eating plan approximately 50% of the time. Randall Chase states he is doing push ups and walking for 20 minutes 3 times per week.  Today's visit was #: 2 Starting weight: 273 lbs Starting date: 07/29/2019 Today's weight: 275 lbs Today's date: 08/12/2019 Total lbs lost to date: 0 Total lbs lost since last in-office visit: 0  Interim History: Randall Chase says he has had a tough 2 weeks due to increased stress at home. He admits to being unable to focus on the meal plan.  Unfortunately, he failed the officer PT exam because he was unable to complete the required pushups. He felt completely drained - weak, shaky. Wonders if it was low blood sugar. He will be going to [place] next week and is hoping that his stress level will improve.   Subjective:   1. Vitamin D deficiency Randall Chase's Vitamin D level was 18.9 on 07/29/2019. He is currently taking no vitamin D supplement. He denies nausea, vomiting or muscle weakness.  2. Prediabetes Atypical lab presentation. A1c in the normal range, but insulin impressively high. Previous dizzy and weak episodes after exercise likely hypoglycemic events. The patient is currently being evaluated for hemachromatosis (which is known to cause secondary diabetes).   Lab Results  Component Value Date   HGBA1C 5.5 06/15/2019   Lab Results  Component Value Date   INSULIN 351.0 (H) 07/29/2019   3. Elevated LFTs Patient still completing workup for: history of polycythemia, elevated ferritin. I have already referred him to have an evaluation for a sleep study. The patent will be having more labs for evaluation of hemachromatosis. Will discuss liver imaging v referral directly to GI with PCP.  Lab Results  Component Value Date   ALT 96 (H)  07/29/2019   AST 59 (H) 07/29/2019   ALKPHOS 118 (H) 07/29/2019   BILITOT 0.3 07/29/2019   Assessment/Plan:   1. Vitamin D deficiency Low Vitamin D level contributes to fatigue and are associated with obesity, breast, and colon cancer. He agrees to start to take prescription Vitamin D @50 ,000 IU every week and will follow-up for routine testing of Vitamin D, at least 2-3 times per year to avoid over-replacement.  Orders - Vitamin D, Ergocalciferol, (DRISDOL) 1.25 MG (50000 UNIT) CAPS capsule; Take 1 capsule (50,000 Units total) by mouth every 7 (seven) days.  Dispense: 4 capsule; Refill: 0  2. Prediabetes Randall Chase will continue to work on weight loss, exercise, and decreasing simple carbohydrates to help decrease the risk of diabetes.   Orders - Dulaglutide (TRULICITY) 0.75 MG/0.5ML SOPN; Inject 0.75 mg into the skin once a week.  Dispense: 4 pen; Refill: 0  3. Elevated LFTs We discussed the likely diagnosis of non-alcoholic fatty liver disease today and how this condition is obesity related. Randall Chase was educated the importance of weight loss. Randall Chase agreed to continue with his weight loss efforts with healthier diet and exercise as an essential part of his treatment plan.  4. At risk for heart disease Randall Chase was given approximately 15 minutes of coronary artery disease prevention counseling today. He is 34 y.o. male and has risk factors for heart disease including obesity. We discussed intensive lifestyle modifications today with an emphasis on specific weight loss instructions and strategies.   Repetitive spaced learning was employed today to  elicit superior memory formation and behavioral change.  5. Class 3 severe obesity with serious comorbidity and body mass index (BMI) of 40.0 to 44.9 in adult, unspecified obesity type (Randall Chase) Randall Chase is currently in the action stage of change. As such, his goal is to continue with weight loss efforts. He has agreed to the Category 4 Plan.    Exercise goals: For substantial health benefits, adults should do at least 150 minutes (2 hours and 30 minutes) a week of moderate-intensity, or 75 minutes (1 hour and 15 minutes) a week of vigorous-intensity aerobic physical activity, or an equivalent combination of moderate- and vigorous-intensity aerobic activity. Aerobic activity should be performed in episodes of at least 10 minutes, and preferably, it should be spread throughout the week.  Behavioral modification strategies: increasing lean protein intake and increasing vegetables.  Randall Chase has agreed to follow-up with our clinic in 2 weeks. He was informed of the importance of frequent follow-up visits to maximize his success with intensive lifestyle modifications for his multiple health conditions.   Objective:   Blood pressure 131/81, pulse 95, temperature 98.4 F (36.9 C), height 5\' 7"  (1.702 m), weight 275 lb (124.7 kg), SpO2 98 %. Body mass index is 43.07 kg/m.  General: Cooperative, alert, well developed, in no acute distress. HEENT: Conjunctivae and lids unremarkable. Cardiovascular: Regular rhythm.  Lungs: Normal work of breathing. Neurologic: No focal deficits.   Lab Results  Component Value Date   CREATININE 0.75 (L) 07/29/2019   BUN 10 07/29/2019   NA 139 07/29/2019   K 4.0 07/29/2019   CL 102 07/29/2019   CO2 22 07/29/2019   Lab Results  Component Value Date   ALT 96 (H) 07/29/2019   AST 59 (H) 07/29/2019   ALKPHOS 118 (H) 07/29/2019   BILITOT 0.3 07/29/2019   Lab Results  Component Value Date   HGBA1C 5.5 06/15/2019   Lab Results  Component Value Date   INSULIN 351.0 (H) 07/29/2019   Lab Results  Component Value Date   TSH 3.53 06/11/2019   Lab Results  Component Value Date   CHOL 174 06/11/2019   HDL 31.90 (L) 06/11/2019   LDLDIRECT 115.0 06/11/2019   TRIG 235.0 (H) 06/11/2019   CHOLHDL 5 06/11/2019   Lab Results  Component Value Date   WBC 7.3 07/14/2019   HGB 15.0 07/14/2019    HCT 42.4 07/14/2019   MCV 87.8 07/14/2019   PLT 327 07/14/2019   Lab Results  Component Value Date   IRON 58 07/14/2019   TIBC 334 07/14/2019   FERRITIN 83 07/14/2019   Attestation Statements:   Reviewed by clinician on day of visit: allergies, medications, problem list, medical history, surgical history, family history, social history, and previous encounter notes.  I, Water quality scientist, CMA, am acting as Location manager for [employer], DO.  I have reviewed the above documentation for accuracy and completeness, and I agree with the above. Briscoe Deutscher, DO

## 2019-08-16 DIAGNOSIS — Z6841 Body Mass Index (BMI) 40.0 and over, adult: Secondary | ICD-10-CM | POA: Insufficient documentation

## 2019-08-16 DIAGNOSIS — R7989 Other specified abnormal findings of blood chemistry: Secondary | ICD-10-CM | POA: Insufficient documentation

## 2019-08-16 DIAGNOSIS — E559 Vitamin D deficiency, unspecified: Secondary | ICD-10-CM | POA: Insufficient documentation

## 2019-08-17 ENCOUNTER — Encounter (INDEPENDENT_AMBULATORY_CARE_PROVIDER_SITE_OTHER): Payer: Self-pay | Admitting: Family Medicine

## 2019-08-19 ENCOUNTER — Inpatient Hospital Stay: Payer: 59 | Attending: Adult Health | Admitting: Oncology

## 2019-08-19 DIAGNOSIS — Z6841 Body Mass Index (BMI) 40.0 and over, adult: Secondary | ICD-10-CM

## 2019-08-19 NOTE — Progress Notes (Signed)
[facility]  [phone]) [phone] [phone]     ID: Randall Chase DOB: 12/02/1985  Randall#: [id number]  BWL#:[id number]  Patient Care Team: Jeoffrey Massed, MD as PCP - General (Family Medicine) Cherly Beach, MD as Consulting Physician (Dermatology) Macaela Presas, Valentino Hue, MD as Consulting Physician (Oncology) Lowella Dell, MD OTHER MD:  CHIEF COMPLAINT: history elevated hemoglobin  CURRENT TREATMENT: undergoing evaluation   INTERVAL HISTORY: Randall Chase did not show for his 08/19/2019 visit.  At the last visit we obtained an EPO level which was 10.4, in the normal range, not suggestive of polycythemia vera.  In addition a ferritin was obtained which was 83, it had been 59 2 months prior.  These are not suggestive of hemochromatosis.  We have a fasting insulin from 05/04/2017 which was 19.6, in the normal range.  Random insulin obtained by his primary care physician 07/29/2019 at 1417 p.m. was 351.   REVIEW OF SYSTEMS: Randall Chase    HISTORY OF CURRENT ILLNESS: From the original intake note:  Randall Chase tells [place] that his history began a couple of years ago when he was living in [place], [place].  He had a hemoglobin that was in the 20s and he saw a nutritionist, he remembers being told his iron was high as well.  He was recommended to donate blood regularly and he has been about every 3 months.  He has no records of this visit or lab values, however he notes that when he established care with Dr. Milinda Cave he brought it up to him, and was recommended a hematology evaluation.  He has a slight elevation in his liver enzymes as well.  His recent hemoglobin and iron studies have remained normal.    Results for BASCOM, BIEL (MRN [id number]) as of 07/15/2019 09:34  Ref. Range 06/11/2019 08:36 07/14/2019 07:59  Hemoglobin Latest Ref Range: 13.0 - 17.0 g/dL 03.5 59.7  Results for GAGANDEEP, PETTET (MRN [id number]) as of 07/15/2019 09:34  Ref.  Range 06/11/2019 08:36 07/14/2019 07:59  Ferritin Latest Ref Range: 24 - 336 ng/mL 59 83    The patient's subsequent history is as detailed below.   PAST MEDICAL HISTORY: Past Medical History:  Diagnosis Date  . Elevated liver enzymes   . Obesity, Class III, BMI 40-49.9 (morbid obesity) (HCC)   . Seborrheic dermatitis of scalp   . Tinea versicolor     PAST SURGICAL HISTORY: Past Surgical History:  Procedure Laterality Date  . TOENAIL EXCISION  2012   Ingrown    FAMILY HISTORY Family History  Problem Relation Age of Onset  . High Cholesterol Mother   . Diabetes Mother   . Depression Father   . Diabetes Father   . High Cholesterol Father   . Diabetes Maternal Grandmother   . Diabetes Maternal Grandfather   . Alcohol abuse Paternal Grandmother   . Diabetes Paternal Grandmother   . Diabetes Paternal Grandfather     SOCIAL HISTORY: (updated 06/2019) Lives with his significant other Randall Chase and his two year old daughter Randall Chase in [place], [place].  He denies tobacco, ETOH, drug use.  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  He does not exercise.     ADVANCED DIRECTIVES: not in place   HEALTH MAINTENANCE: Social History   Tobacco Use  . Smoking status: Never Smoker  . Smokeless tobacco: Never Used  Substance Use Topics  . Alcohol use: Never  . Drug use:  Never      No Known Allergies  Current Outpatient Medications  Medication Sig Dispense Refill  . COCONUT OIL PO Take by mouth every other day.    . Dulaglutide (TRULICITY) 0.75 MG/0.5ML SOPN Inject 0.75 mg into the skin once a week. 4 pen 0  . fluconazole (DIFLUCAN) 100 MG tablet Take 100 mg by mouth once a week.    Marland Kitchen ketoconazole (NIZORAL) 2 % shampoo USE 3 TIMES PER WEEK ALTERNATING WITH OVER THE COUNTER DANDRUFF SHAMPOO    . nystatin cream (MYCOSTATIN) APPLY TO AFFECTED AREA TWICE A DAY ON FACE AS NEEDED**MIX EQUAL PARTS WITH TRIAMCINOLONE CREAM**      . OREGANO PO Take by mouth every other day.    . triamcinolone cream (KENALOG) 0.1 % APPLY TO AFFECTED AREA ON FACE TWICE A DAY AS NEEDED **MIXED IN EQUAL PARTS WITH NYSTATIN**    . Vitamin D, Ergocalciferol, (DRISDOL) 1.25 MG (50000 UNIT) CAPS capsule Take 1 capsule (50,000 Units total) by mouth every 7 (seven) days. 4 capsule 0   No current facility-administered medications for this visit.    OBJECTIVE:   There were no vitals filed for this visit.   There is no height or weight on file to calculate BMI.   Wt Readings from Last 3 Encounters:  08/12/19 275 lb (124.7 kg)  07/29/19 273 lb (123.8 kg)  07/14/19 277 lb 9.6 oz (125.9 kg)  ECOG FS:0 - Asymptomatic    LAB RESULTS:  CMP     Component Value Date/Time   NA 139 07/29/2019 1417   K 4.0 07/29/2019 1417   CL 102 07/29/2019 1417   CO2 22 07/29/2019 1417   GLUCOSE 129 (H) 07/29/2019 1417   GLUCOSE 136 (H) 07/14/2019 0759   BUN 10 07/29/2019 1417   CREATININE 0.75 (L) 07/29/2019 1417   CREATININE 0.90 07/14/2019 0759   CALCIUM 9.7 07/29/2019 1417   PROT 8.1 07/29/2019 1417   ALBUMIN 4.6 07/29/2019 1417   AST 59 (H) 07/29/2019 1417   AST 61 (H) 07/14/2019 0759   ALT 96 (H) 07/29/2019 1417   ALT 105 (H) 07/14/2019 0759   ALKPHOS 118 (H) 07/29/2019 1417   BILITOT 0.3 07/29/2019 1417   BILITOT 0.4 07/14/2019 0759   GFRNONAA 120 07/29/2019 1417   GFRNONAA >60 07/14/2019 0759   GFRAA 138 07/29/2019 1417   GFRAA >60 07/14/2019 0759    No results found for: TOTALPROTELP, ALBUMINELP, A1GS, A2GS, BETS, BETA2SER, GAMS, MSPIKE, SPEI  No results found for: Ron Parker, [facility]  Lab Results  Component Value Date   WBC 7.3 07/14/2019   NEUTROABS 3.5 07/14/2019   HGB 15.0 07/14/2019   HCT 42.4 07/14/2019   MCV 87.8 07/14/2019   PLT 327 07/14/2019      Chemistry      Component Value Date/Time   NA 139 07/29/2019 1417   K 4.0 07/29/2019 1417   CL 102 07/29/2019 1417   CO2 22 07/29/2019 1417   BUN  10 07/29/2019 1417   CREATININE 0.75 (L) 07/29/2019 1417   CREATININE 0.90 07/14/2019 0759      Component Value Date/Time   CALCIUM 9.7 07/29/2019 1417   ALKPHOS 118 (H) 07/29/2019 1417   AST 59 (H) 07/29/2019 1417   AST 61 (H) 07/14/2019 0759   ALT 96 (H) 07/29/2019 1417   ALT 105 (H) 07/14/2019 0759   BILITOT 0.3 07/29/2019 1417   BILITOT 0.4 07/14/2019 0759      No results found for: LABCA2  No components  found for: QIHKVQ259  No results for input(s): INR in the last 168 hours.  No results found for: LABCA2  No results found for: DGL875  No results found for: IEP329  No results found for: JJO841  No results found for: CA2729  No components found for: HGQUANT  No results found for: CEA1 / No results found for: CEA1   No results found for: AFPTUMOR  No results found for: CHROMOGRNA  No results found for: HGBA, HGBA2QUANT, HGBFQUANT, HGBSQUAN (Hemoglobinopathy evaluation)   No results found for: LDH  Lab Results  Component Value Date   IRON 58 07/14/2019   TIBC 334 07/14/2019   IRONPCTSAT 17 (L) 07/14/2019   (Iron and TIBC)  Lab Results  Component Value Date   FERRITIN 83 07/14/2019    Urinalysis No results found for: COLORURINE, APPEARANCEUR, LABSPEC, PHURINE, GLUCOSEU, HGBUR, BILIRUBINUR, KETONESUR, PROTEINUR, UROBILINOGEN, NITRITE, LEUKOCYTESUR   STUDIES: No results found.   ELIGIBLE FOR AVAILABLE RESEARCH PROTOCOL:   ASSESSMENT: 34 y.o. male with history of elevated hemoglobin/iron per his report  1) Polycythemia  (a) history highly suspicious for sleep apnea, recommended sleep study and weight loss  (b) EPO level pending  (c) awaiting lab studies from 2 years ago from patient, will get Jak 2 depending on those  2) Elevated Iron  (a) awaiting lab studies from 2 years ago, will get panel depending on those   PLAN: Randall. Cross did not show for his 08/19/2019 visit.  Virgie Dad. Sreekar Broyhill, MD  08/19/2019 6:20 PM Medical Oncology and  Hematology [facility] [place], [place] [address] Tel. [phone]    Fax. [phone]   I, Wilburn Mylar, am acting as scribe for Dr. Virgie Dad. Joyanne Eddinger.  I, Lurline Del MD, have reviewed the above documentation for accuracy and completeness, and I agree with the above.    *Total Encounter Time as defined by the Centers for Medicare and Medicaid Services includes, in addition to the face-to-face time of a patient visit (documented in the note above) non-face-to-face time: obtaining and reviewing outside history, ordering and reviewing medications, tests or procedures, care coordination (communications with other health care professionals or caregivers) and documentation in the medical record.

## 2019-08-24 ENCOUNTER — Encounter (INDEPENDENT_AMBULATORY_CARE_PROVIDER_SITE_OTHER): Payer: Self-pay | Admitting: *Deleted

## 2019-08-26 ENCOUNTER — Institutional Professional Consult (permissible substitution): Payer: 59 | Admitting: Neurology

## 2019-09-09 ENCOUNTER — Other Ambulatory Visit: Payer: Self-pay

## 2019-09-09 ENCOUNTER — Encounter (INDEPENDENT_AMBULATORY_CARE_PROVIDER_SITE_OTHER): Payer: Self-pay | Admitting: Physician Assistant

## 2019-09-09 ENCOUNTER — Ambulatory Visit (INDEPENDENT_AMBULATORY_CARE_PROVIDER_SITE_OTHER): Payer: 59 | Admitting: Physician Assistant

## 2019-09-09 VITALS — BP 128/84 | HR 87 | Temp 97.8°F | Ht 67.0 in | Wt 271.0 lb

## 2019-09-09 DIAGNOSIS — E559 Vitamin D deficiency, unspecified: Secondary | ICD-10-CM

## 2019-09-09 DIAGNOSIS — Z6841 Body Mass Index (BMI) 40.0 and over, adult: Secondary | ICD-10-CM

## 2019-09-09 DIAGNOSIS — Z9189 Other specified personal risk factors, not elsewhere classified: Secondary | ICD-10-CM

## 2019-09-09 DIAGNOSIS — R7303 Prediabetes: Secondary | ICD-10-CM | POA: Diagnosis not present

## 2019-09-09 MED ORDER — TRULICITY 0.75 MG/0.5ML ~~LOC~~ SOAJ: 1.5000 mg | SUBCUTANEOUS | 0 refills | Status: DC

## 2019-09-09 MED ORDER — VITAMIN D (ERGOCALCIFEROL) 1.25 MG (50000 UNIT) PO CAPS: 50000.0000 [IU] | ORAL_CAPSULE | ORAL | 0 refills | Status: DC

## 2019-09-13 NOTE — Progress Notes (Signed)
Chief Complaint:   OBESITY Randall Chase is here to discuss his progress with his obesity treatment plan along with follow-up of his obesity related diagnoses. Randall Chase is on the Category 4 Plan and states he is following his eating plan approximately 60-65% of the time. Randall Chase states he is boxing 60 minutes 3 times per week.  Today's visit was #: 3 Starting weight: 273 lbs Starting date: 07/29/2019 Today's weight: 271 lbs Today's date: 09/09/2019 Total lbs lost to date: 2 Total lbs lost since last in-office visit: 4  Interim History: Randall Chase reports working overnight. He skips his lunch meal often due to sleeping during the day and does not eat it later. He is eating rice often, trying to appease his wife.  Subjective:   Prediabetes. Randall Chase has a diagnosis of prediabetes based on his elevated HgA1c and was informed this puts him at greater risk of developing diabetes. He continues to work on diet and exercise to decrease his risk of diabetes. He denies nausea or hypoglycemia. Randall Chase is on Trulicty.  He reports increased hunger at the end of the week.  Lab Results  Component Value Date   HGBA1C 5.5 06/15/2019   Lab Results  Component Value Date   INSULIN 351.0 (H) 07/29/2019   Vitamin D deficiency. Randall Chase is on prescription Vitamin D weekly. No nausea, vomiting, or muscle weakness.    Ref. Range 07/29/2019 14:17  Vitamin D, 25-Hydroxy Latest Ref Range: 30.0 - 100.0 ng/mL 18.9 (L)   At risk for diabetes mellitus. Randall Chase is at higher than average risk for developing diabetes due to his obesity.   Assessment/Plan:   Prediabetes. Randall Chase will continue to work on weight loss, exercise, and decreasing simple carbohydrates to help decrease the risk of diabetes. Randall Chase will increase his Dulaglutide (TRULICITY) 0.75 MG/0.5ML SOPN to 1.5 mg SQ weekly #3 pens with 0 refills.  Vitamin D deficiency. Low Vitamin D level contributes to fatigue and are  associated with obesity, breast, and colon cancer. He was given a refill on his Vitamin D, Ergocalciferol, (DRISDOL) 1.25 MG (50000 UNIT) CAPS capsule every week #4 with 0 refills and will follow-up for routine testing of Vitamin D, at least 2-3 times per year to avoid over-replacement.   At risk for diabetes mellitus. Randall Chase was given approximately 15 minutes of diabetes education and counseling today. We discussed intensive lifestyle modifications today with an emphasis on weight loss as well as increasing exercise and decreasing simple carbohydrates in his diet. We also reviewed medication options with an emphasis on risk versus benefit of those discussed.   Repetitive spaced learning was employed today to elicit superior memory formation and behavioral change.  Class 3 severe obesity with serious comorbidity and body mass index (BMI) of 40.0 to 44.9 in adult, unspecified obesity type (HCC).  Randall Chase is currently in the action stage of change. As such, his goal is to continue with weight loss efforts. He has agreed to the Category 4 Plan.   Exercise goals: For substantial health benefits, adults should do at least 150 minutes (2 hours and 30 minutes) a week of moderate-intensity, or 75 minutes (1 hour and 15 minutes) a week of vigorous-intensity aerobic physical activity, or an equivalent combination of moderate- and vigorous-intensity aerobic activity. Aerobic activity should be performed in episodes of at least 10 minutes, and preferably, it should be spread throughout the week.  Behavioral modification strategies: decreasing simple carbohydrates and no skipping meals.  Randall Chase has agreed to follow-up with our clinic in  4 weeks. He was informed of the importance of frequent follow-up visits to maximize his success with intensive lifestyle modifications for his multiple health conditions.   Objective:   Blood pressure 128/84, pulse 87, temperature 97.8 F (36.6 C), temperature source  Oral, height 5\' 7"  (1.702 m), weight 271 lb (122.9 kg), SpO2 99 %. Body mass index is 42.44 kg/m.  General: Cooperative, alert, well developed, in no acute distress. HEENT: Conjunctivae and lids unremarkable. Cardiovascular: Regular rhythm.  Lungs: Normal work of breathing. Neurologic: No focal deficits.   Lab Results  Component Value Date   CREATININE 0.75 (L) 07/29/2019   BUN 10 07/29/2019   NA 139 07/29/2019   K 4.0 07/29/2019   CL 102 07/29/2019   CO2 22 07/29/2019   Lab Results  Component Value Date   ALT 96 (H) 07/29/2019   AST 59 (H) 07/29/2019   ALKPHOS 118 (H) 07/29/2019   BILITOT 0.3 07/29/2019   Lab Results  Component Value Date   HGBA1C 5.5 06/15/2019   Lab Results  Component Value Date   INSULIN 351.0 (H) 07/29/2019   Lab Results  Component Value Date   TSH 3.53 06/11/2019   Lab Results  Component Value Date   CHOL 174 06/11/2019   HDL 31.90 (L) 06/11/2019   LDLDIRECT 115.0 06/11/2019   TRIG 235.0 (H) 06/11/2019   CHOLHDL 5 06/11/2019   Lab Results  Component Value Date   WBC 7.3 07/14/2019   HGB 15.0 07/14/2019   HCT 42.4 07/14/2019   MCV 87.8 07/14/2019   PLT 327 07/14/2019   Lab Results  Component Value Date   IRON 58 07/14/2019   TIBC 334 07/14/2019   FERRITIN 83 07/14/2019   Attestation Statements:   Reviewed by clinician on day of visit: allergies, medications, problem list, medical history, surgical history, family history, social history, and previous encounter notes.  IMichaelene Chase, am acting as transcriptionist for Randall Potash, PA-C   I have reviewed the above documentation for accuracy and completeness, and I agree with the above. Randall Potash, PA-C

## 2019-09-30 ENCOUNTER — Other Ambulatory Visit (INDEPENDENT_AMBULATORY_CARE_PROVIDER_SITE_OTHER): Payer: Self-pay | Admitting: Physician Assistant

## 2019-09-30 DIAGNOSIS — R7303 Prediabetes: Secondary | ICD-10-CM

## 2019-10-07 ENCOUNTER — Other Ambulatory Visit: Payer: Self-pay

## 2019-10-07 ENCOUNTER — Ambulatory Visit (INDEPENDENT_AMBULATORY_CARE_PROVIDER_SITE_OTHER): Payer: 59 | Admitting: Family Medicine

## 2019-10-07 ENCOUNTER — Encounter (INDEPENDENT_AMBULATORY_CARE_PROVIDER_SITE_OTHER): Payer: Self-pay | Admitting: Family Medicine

## 2019-10-07 VITALS — BP 108/72 | HR 101 | Temp 97.9°F | Ht 67.0 in | Wt 270.0 lb

## 2019-10-07 DIAGNOSIS — Z9189 Other specified personal risk factors, not elsewhere classified: Secondary | ICD-10-CM

## 2019-10-07 DIAGNOSIS — R932 Abnormal findings on diagnostic imaging of liver and biliary tract: Secondary | ICD-10-CM

## 2019-10-07 DIAGNOSIS — R7303 Prediabetes: Secondary | ICD-10-CM

## 2019-10-07 DIAGNOSIS — K760 Fatty (change of) liver, not elsewhere classified: Secondary | ICD-10-CM

## 2019-10-07 DIAGNOSIS — E161 Other hypoglycemia: Secondary | ICD-10-CM | POA: Diagnosis not present

## 2019-10-07 DIAGNOSIS — R7989 Other specified abnormal findings of blood chemistry: Secondary | ICD-10-CM

## 2019-10-07 DIAGNOSIS — Z6841 Body Mass Index (BMI) 40.0 and over, adult: Secondary | ICD-10-CM

## 2019-10-07 DIAGNOSIS — E559 Vitamin D deficiency, unspecified: Secondary | ICD-10-CM

## 2019-10-07 DIAGNOSIS — R948 Abnormal results of function studies of other organs and systems: Secondary | ICD-10-CM

## 2019-10-08 LAB — COMPREHENSIVE METABOLIC PANEL
ALT: 71 IU/L — ABNORMAL HIGH (ref 0–44)
AST: 60 IU/L — ABNORMAL HIGH (ref 0–40)
Albumin/Globulin Ratio: 1.2 (ref 1.2–2.2)
Albumin: 4.3 g/dL (ref 4.0–5.0)
Alkaline Phosphatase: 103 IU/L (ref 48–121)
BUN/Creatinine Ratio: 11 (ref 9–20)
BUN: 9 mg/dL (ref 6–20)
Bilirubin Total: 0.8 mg/dL (ref 0.0–1.2)
CO2: 23 mmol/L (ref 20–29)
Calcium: 9.6 mg/dL (ref 8.7–10.2)
Chloride: 101 mmol/L (ref 96–106)
Creatinine, Ser: 0.83 mg/dL (ref 0.76–1.27)
GFR calc Af Amer: 133 mL/min/{1.73_m2} (ref >59)
GFR calc non Af Amer: 115 mL/min/{1.73_m2} (ref >59)
Globulin, Total: 3.6 g/dL (ref 1.5–4.5)
Glucose: 113 mg/dL — ABNORMAL HIGH (ref 65–99)
Potassium: 3.8 mmol/L (ref 3.5–5.2)
Sodium: 138 mmol/L (ref 134–144)
Total Protein: 7.9 g/dL (ref 6.0–8.5)

## 2019-10-08 LAB — INSULIN, RANDOM: INSULIN: 372 u[IU]/mL — ABNORMAL HIGH (ref 2.6–24.9)

## 2019-10-10 MED ORDER — TRULICITY 0.75 MG/0.5ML ~~LOC~~ SOAJ: 1.5000 mg | SUBCUTANEOUS | 0 refills | Status: DC

## 2019-10-10 MED ORDER — VITAMIN D (ERGOCALCIFEROL) 1.25 MG (50000 UNIT) PO CAPS: 50000.0000 [IU] | ORAL_CAPSULE | ORAL | 0 refills | Status: DC

## 2019-10-11 ENCOUNTER — Other Ambulatory Visit (INDEPENDENT_AMBULATORY_CARE_PROVIDER_SITE_OTHER): Payer: Self-pay | Admitting: Family Medicine

## 2019-10-11 ENCOUNTER — Encounter (INDEPENDENT_AMBULATORY_CARE_PROVIDER_SITE_OTHER): Payer: Self-pay | Admitting: Family Medicine

## 2019-10-11 DIAGNOSIS — R7303 Prediabetes: Secondary | ICD-10-CM

## 2019-10-11 NOTE — Progress Notes (Signed)
Chief Complaint:   OBESITY Randall Chase is here to discuss his progress with his obesity treatment plan along with follow-up of his obesity related diagnoses. Randall Chase is on the Category 1 Plan and states he is following his eating plan approximately 65% of the time. Randall Chase states he is exercising for 0 minutes 0 times per week.  Today's visit was #: 4 Starting weight: 273 lbs Starting date: 07/29/2019 Today's weight: 270 lbs Today's date: 10/07/2019 Total lbs lost to date: 3 lbs Total lbs lost since last in-office visit: 1 lb  Interim History: Randall Chase says he is eating to make his wife happy.  She does not want him to lose weight.  He says he is working one job now, but more hours.  He is finding water "disgusting" and is drinking sweet tea instead.  Subjective:   1. Prediabetes Randall Chase has a diagnosis of prediabetes based on his elevated HgA1c and was informed this puts him at greater risk of developing diabetes. He continues to work on diet and exercise to decrease his risk of diabetes. He denies nausea or hypoglycemia.  He still has polyphagia.  He is taking Trulicity and next Sunday will be his next dose.  Lab Results  Component Value Date   HGBA1C 5.5 06/15/2019   Lab Results  Component Value Date   INSULIN 372.0 (H) 10/07/2019   INSULIN 351.0 (H) 07/29/2019   2. Vitamin D deficiency Randall Chase's Vitamin D level was 18.9 on 07/29/2019. He is currently taking prescription vitamin D 50,000 IU each week. He denies nausea, vomiting or muscle weakness.  3. Increased insulin level Randall Chase last ate today at 11 am.  Insulin was 372.0 on 10/07/2019.  4. Risk factors for obstructive sleep apnea Randall Chase has several risk factors for OSA including snoring, obesity, and apneic episodes.  5. At risk for heart disease Randall Chase is at a higher than average risk for cardiovascular disease due to obesity.   Assessment/Plan:   1. Prediabetes Langley will continue to work  on weight loss, exercise, and decreasing simple carbohydrates to help decrease the risk of diabetes.    - Dulaglutide (TRULICITY) 0.75 MG/0.5ML SOPN; Inject 1 mL (1.5 mg total) into the skin once a week.  Dispense: 4 pen; Refill: 0 - Comprehensive metabolic panel  2. Vitamin D deficiency Low Vitamin D level contributes to fatigue and are associated with obesity, breast, and colon cancer. He agrees to continue to take prescription Vitamin D @50 ,000 IU every week and will follow-up for routine testing of Vitamin D, at least 2-3 times per year to avoid over-replacement.  - Vitamin D, Ergocalciferol, (DRISDOL) 1.25 MG (50000 UNIT) CAPS capsule; Take 1 capsule (50,000 Units total) by mouth every 7 (seven) days.  Dispense: 4 capsule; Refill: 0  3. Increased insulin level Will recheck insulin level.  History of questionable hypoglycemic events.  Normal A1c.  Consider of pancreas.   - Insulin, random  4. Risk factors for obstructive sleep apnea Encouraged him to call for an appointment with Sleep Medicine.  5. At risk for heart disease Hisham was given approximately 15 minutes of coronary artery disease prevention counseling today. He is 34 y.o. male and has risk factors for heart disease including obesity. We discussed intensive lifestyle modifications today with an emphasis on specific weight loss instructions and strategies.   Repetitive spaced learning was employed today to elicit superior memory formation and behavioral change.  6. Class 3 severe obesity with serious comorbidity and body mass index (BMI) of  40.0 to 44.9 in adult, unspecified obesity type (HCC) Randall Chase is currently in the action stage of change. As such, his goal is to continue with weight loss efforts. He has agreed to the Category 1 Plan.   Exercise goals: For substantial health benefits, adults should do at least 150 minutes (2 hours and 30 minutes) a week of moderate-intensity, or 75 minutes (1 hour and 15 minutes)  a week of vigorous-intensity aerobic physical activity, or an equivalent combination of moderate- and vigorous-intensity aerobic activity. Aerobic activity should be performed in episodes of at least 10 minutes, and preferably, it should be spread throughout the week.  Behavioral modification strategies: increasing lean protein intake, decreasing simple carbohydrates, increasing vegetables, increasing water intake and increasing high fiber foods.  Randall Chase has agreed to follow-up with our clinic in 4 weeks. He was informed of the importance of frequent follow-up visits to maximize his success with intensive lifestyle modifications for his multiple health conditions.   Objective:   Blood pressure 108/72, pulse 101, temperature 97.9 F (36.6 C), temperature source Oral, height 5\' 7"  (1.702 m), weight (!) 270 lb (122.5 kg), SpO2 97 %. Body mass index is 42.29 kg/m.  General: Cooperative, alert, well developed, in no acute distress. HEENT: Conjunctivae and lids unremarkable. Cardiovascular: Regular rhythm.  Lungs: Normal work of breathing. Neurologic: No focal deficits.   Lab Results  Component Value Date   CREATININE 0.83 10/07/2019   BUN 9 10/07/2019   NA 138 10/07/2019   K 3.8 10/07/2019   CL 101 10/07/2019   CO2 23 10/07/2019   Lab Results  Component Value Date   ALT 71 (H) 10/07/2019   AST 60 (H) 10/07/2019   ALKPHOS 103 10/07/2019   BILITOT 0.8 10/07/2019   Lab Results  Component Value Date   HGBA1C 5.5 06/15/2019   Lab Results  Component Value Date   INSULIN 372.0 (H) 10/07/2019   INSULIN 351.0 (H) 07/29/2019   Lab Results  Component Value Date   TSH 3.53 06/11/2019   Lab Results  Component Value Date   CHOL 174 06/11/2019   HDL 31.90 (L) 06/11/2019   LDLDIRECT 115.0 06/11/2019   TRIG 235.0 (H) 06/11/2019   CHOLHDL 5 06/11/2019   Lab Results  Component Value Date   WBC 7.3 07/14/2019   HGB 15.0 07/14/2019   HCT 42.4 07/14/2019   MCV 87.8 07/14/2019    PLT 327 07/14/2019   Lab Results  Component Value Date   IRON 58 07/14/2019   TIBC 334 07/14/2019   FERRITIN 83 07/14/2019   Attestation Statements:   Reviewed by clinician on day of visit: allergies, medications, problem list, medical history, surgical history, family history, social history, and previous encounter notes.  I, 07/16/2019, CMA, am acting as transcriptionist for Insurance claims handler, DO  I have reviewed the above documentation for accuracy and completeness, and I agree with the above. Helane Rima, DO

## 2019-10-12 NOTE — Telephone Encounter (Signed)
Please review and advise.

## 2019-10-25 NOTE — Addendum Note (Signed)
Addended by: Scarlett Presto on: 10/25/2019 06:43 AM   Modules accepted: Orders

## 2019-10-29 ENCOUNTER — Ambulatory Visit
Admission: RE | Admit: 2019-10-29 | Discharge: 2019-10-29 | Disposition: A | Discharge status: Home / Self Care | Payer: 59 | Source: Ambulatory Visit | Attending: Family Medicine | Admitting: Family Medicine

## 2019-10-31 ENCOUNTER — Other Ambulatory Visit (INDEPENDENT_AMBULATORY_CARE_PROVIDER_SITE_OTHER): Payer: Self-pay | Admitting: Family Medicine

## 2019-10-31 DIAGNOSIS — R7303 Prediabetes: Secondary | ICD-10-CM

## 2019-11-02 NOTE — Addendum Note (Signed)
Addended by: Helane Rima R on: 11/02/2019 06:38 AM   Modules accepted: Orders

## 2019-11-03 ENCOUNTER — Other Ambulatory Visit (INDEPENDENT_AMBULATORY_CARE_PROVIDER_SITE_OTHER): Payer: Self-pay | Admitting: Family Medicine

## 2019-11-03 DIAGNOSIS — E559 Vitamin D deficiency, unspecified: Secondary | ICD-10-CM

## 2019-11-04 ENCOUNTER — Other Ambulatory Visit: Payer: Self-pay

## 2019-11-04 ENCOUNTER — Ambulatory Visit (INDEPENDENT_AMBULATORY_CARE_PROVIDER_SITE_OTHER): Payer: 59 | Admitting: Family Medicine

## 2019-11-04 ENCOUNTER — Encounter (INDEPENDENT_AMBULATORY_CARE_PROVIDER_SITE_OTHER): Payer: Self-pay | Admitting: Family Medicine

## 2019-11-04 VITALS — BP 133/77 | HR 85 | Temp 98.2°F | Ht 67.0 in | Wt 269.0 lb

## 2019-11-04 DIAGNOSIS — K760 Fatty (change of) liver, not elsewhere classified: Secondary | ICD-10-CM

## 2019-11-04 DIAGNOSIS — E161 Other hypoglycemia: Secondary | ICD-10-CM

## 2019-11-04 DIAGNOSIS — Z6841 Body Mass Index (BMI) 40.0 and over, adult: Secondary | ICD-10-CM

## 2019-11-04 NOTE — Progress Notes (Signed)
Chief Complaint:   OBESITY Randall Chase is here to discuss his progress with his obesity treatment plan along with follow-up of his obesity related diagnoses. Randall Chase is on the Category 1 Plan and states he is following his eating plan approximately 70% of the time. Randall Chase states he is doing cardio for 20 minutes 3-4 times per week.  Today's visit was #: 5 Starting weight: 273 lbs Starting date: 07/29/2019 Today's weight: 269 lbs Today's date: 11/04/2019 Total lbs lost to date: 4 lbs Total lbs lost since last in-office visit: 1 lb  Interim History:   Randall Chase Abdomen Complete  Result Date: 10/29/2019 CLINICAL DATA:  History of prediabetes.  Elevated LFTs. EXAM: ABDOMEN ULTRASOUND COMPLETE COMPARISON:  None. FINDINGS: Gallbladder: No gallstones or wall thickening visualized. No sonographic Murphy sign noted by sonographer. Common bile duct: Diameter: 0.1 cm, within normal limits. Liver: No focal lesion identified. The liver parenchymal echogenicity is diffusely increased. Portal vein is patent on color Doppler imaging with normal direction of blood flow towards the liver. IVC: No abnormality visualized. Pancreas: Nonspecific mild heterogeneity of the pancreas. Spleen: Size and appearance within normal limits. Right Kidney: Length: 12.0 cm. Echogenicity within normal limits. No mass or hydronephrosis visualized. Left Kidney: Length: 13.3 cm. Echogenicity within normal limits. No mass or hydronephrosis visualized. Abdominal aorta: No aneurysm visualized. Other findings: None. IMPRESSION: 1. Diffusely increased echogenicity of the liver most commonly seen in hepatic steatosis. 2. Mild heterogeneity of the pancreas which is nonspecific, may indicate some degree of inflammation. Electronically Signed   By: Emmaline Kluver M.D.   On: 10/29/2019 16:13   Assessment/Plan:   1. Increased insulin level While hyperinsulinemia is common in patients with obesity, this patient's fasting insulin levels seem  excessive to me. He has experienced symptoms of hypoglycemia with exercise. He has felt no improvement with GLP1RA. I referred the patient to Endocrinology for consult and education here. Unfortunately, the referral was declined without explanation.   Lab Results  Component Value Date   INSULIN 372.0 (H) 10/07/2019   INSULIN 351.0 (H) 07/29/2019   Lab Results  Component Value Date   HGBA1C 5.5 06/15/2019   2. Fatty liver Treatment goal: 7-10% reduction of body weight.  Aerobic and resistance physical activity are important to help achieve a healthy body weight BUT also increases peripheral insulin sensitivity, reducing delivery of free fatty acids and glucose to the liver. Exercise also increases intrahepatic fatty acid oxidation, decreasing fatty acid synthesis, and helps prevent mitochondrial and hepatocellular damage. Medications that can help reduce hepatic fat include metformin and GLP1RAs.   3. Class 3 severe obesity with serious comorbidity and body mass index (BMI) of 40.0 to 44.9 in adult, unspecified obesity type (HCC) Randall Chase is currently in the action stage of change. As such, his goal is to continue with weight loss efforts. He has agreed to the Category 1 Plan.   Exercise goals: For substantial health benefits, adults should do at least 150 minutes (2 hours and 30 minutes) a week of moderate-intensity, or 75 minutes (1 hour and 15 minutes) a week of vigorous-intensity aerobic physical activity, or an equivalent combination of moderate- and vigorous-intensity aerobic activity. Aerobic activity should be performed in episodes of at least 10 minutes, and preferably, it should be spread throughout the week.  Behavioral modification strategies: increasing lean protein intake, decreasing simple carbohydrates and increasing vegetables.  Randall Chase has agreed to follow-up with our clinic in 3-4 weeks. He was informed of the importance of frequent follow-up visits  to maximize his success  with intensive lifestyle modifications for his multiple health conditions.   Objective:   Blood pressure 133/77, pulse 85, temperature 98.2 F (36.8 C), temperature source Oral, height 5\' 7"  (1.702 m), weight 269 lb (122 kg), SpO2 96 %. Body mass index is 42.13 kg/m.  General: Cooperative, alert, well developed, in no acute distress. HEENT: Conjunctivae and lids unremarkable. Cardiovascular: Regular rhythm.  Lungs: Normal work of breathing. Neurologic: No focal deficits.   Lab Results  Component Value Date   CREATININE 0.83 10/07/2019   BUN 9 10/07/2019   NA 138 10/07/2019   K 3.8 10/07/2019   CL 101 10/07/2019   CO2 23 10/07/2019   Lab Results  Component Value Date   ALT 71 (H) 10/07/2019   AST 60 (H) 10/07/2019   ALKPHOS 103 10/07/2019   BILITOT 0.8 10/07/2019   Lab Results  Component Value Date   HGBA1C 5.5 06/15/2019   Lab Results  Component Value Date   INSULIN 372.0 (H) 10/07/2019   INSULIN 351.0 (H) 07/29/2019   Lab Results  Component Value Date   TSH 3.53 06/11/2019   Lab Results  Component Value Date   CHOL 174 06/11/2019   HDL 31.90 (L) 06/11/2019   LDLDIRECT 115.0 06/11/2019   TRIG 235.0 (H) 06/11/2019   CHOLHDL 5 06/11/2019   Lab Results  Component Value Date   WBC 7.3 07/14/2019   HGB 15.0 07/14/2019   HCT 42.4 07/14/2019   MCV 87.8 07/14/2019   PLT 327 07/14/2019   Lab Results  Component Value Date   IRON 58 07/14/2019   TIBC 334 07/14/2019   FERRITIN 83 07/14/2019   Attestation Statements:   Reviewed by clinician on day of visit: allergies, medications, problem list, medical history, surgical history, family history, social history, and previous encounter notes.  Time spent on visit including pre-visit chart review and post-visit care and charting was 25 minutes.   I, 07/16/2019, CMA, am acting as transcriptionist for Insurance claims handler, DO  I have reviewed the above documentation for accuracy and completeness, and I agree with the  above. Helane Rima, DO

## 2019-12-07 ENCOUNTER — Encounter (INDEPENDENT_AMBULATORY_CARE_PROVIDER_SITE_OTHER): Payer: Self-pay

## 2019-12-07 ENCOUNTER — Ambulatory Visit (INDEPENDENT_AMBULATORY_CARE_PROVIDER_SITE_OTHER): Payer: 59 | Admitting: Family Medicine

## 2020-01-03 ENCOUNTER — Other Ambulatory Visit: Payer: Self-pay

## 2020-01-03 ENCOUNTER — Encounter: Payer: Self-pay | Admitting: Endocrinology

## 2020-01-03 ENCOUNTER — Ambulatory Visit: Payer: 59 | Admitting: Endocrinology

## 2020-01-03 VITALS — BP 122/88 | HR 97 | Ht 67.0 in | Wt 278.0 lb

## 2020-01-03 DIAGNOSIS — R7303 Prediabetes: Secondary | ICD-10-CM | POA: Diagnosis not present

## 2020-01-03 LAB — POCT GLYCOSYLATED HEMOGLOBIN (HGB A1C): Hemoglobin A1C: 6.3 % — AB (ref 4.0–5.6)

## 2020-01-03 MED ORDER — ONETOUCH VERIO VI STRP
1.0000 | ORAL_STRIP | Freq: Every day | 3 refills | Status: AC
Start: 2020-01-03 — End: ?

## 2020-01-03 MED ORDER — CANAGLIFLOZIN 300 MG PO TABS: 300.0000 mg | ORAL_TABLET | Freq: Every day | ORAL | 3 refills | Status: DC

## 2020-01-03 MED ORDER — SILDENAFIL CITRATE 100 MG PO TABS: 50.0000 mg | ORAL_TABLET | Freq: Every day | ORAL | 11 refills | Status: DC | PRN

## 2020-01-03 MED ORDER — TRULICITY 1.5 MG/0.5ML ~~LOC~~ SOAJ: 1.5000 mg | SUBCUTANEOUS | 3 refills | Status: DC

## 2020-01-03 NOTE — Progress Notes (Signed)
Subjective:    Patient ID: Randall Chase, male    DOB: Sep 18, 1985, 34 y.o.   MRN: [id number]  HPI pt is referred by Dr Earlene Plater, for diabetes.  Pt states DM was dx'ed in 2021; he is unaware of any chronic complications; he has never been on insulin; pt says his diet and exercise not good; he has never had pancreatitis, pancreatic surgery, severe hypoglycemia or DKA.   Past Medical History:  Diagnosis Date  . Elevated liver enzymes   . Obesity, Class III, BMI 40-49.9 (morbid obesity) (HCC)   . Seborrheic dermatitis of scalp   . Tinea versicolor     Past Surgical History:  Procedure Laterality Date  . TOENAIL EXCISION  2012   Ingrown    Social History   Socioeconomic History  . Marital status: Married    Spouse name: Not on file  . Number of children: Not on file  . Years of education: Not on file  . Highest education level: Not on file  Occupational History  . Occupation: Doctor, general practice, EMT  Tobacco Use  . Smoking status: Never Smoker  . Smokeless tobacco: Never Used  Substance and Sexual Activity  . Alcohol use: Never  . Drug use: Never  . Sexual activity: Yes    Partners: Female    Birth control/protection: None  Other Topics Concern  . Not on file  Social History Narrative   Longtime girlfriend, 2 y/o daughter.   Relocated to [place] from [place], [place] [address].  Orig from [place].   College: Assoc degree in [place].   Occup: Data entry, asset recovery.   No T/A/Ds   Social Determinants of Health   Financial Resource Strain:   . Difficulty of Paying Living Expenses: Not on file  Food Insecurity:   . Worried About Programme researcher, broadcasting/film/video in the Last Year: Not on file  . Ran Out of Food in the Last Year: Not on file  Transportation Needs:   . Lack of Transportation (Medical): Not on file  . Lack of Transportation (Non-Medical): Not on file  Physical Activity:   . Days of Exercise per Week: Not on file  . Minutes of Exercise per Session: Not on file  Stress:   .  Feeling of Stress : Not on file  Social Connections:   . Frequency of Communication with Friends and Family: Not on file  . Frequency of Social Gatherings with Friends and Family: Not on file  . Attends Religious Services: Not on file  . Active Member of Clubs or Organizations: Not on file  . Attends Banker Meetings: Not on file  . Marital Status: Not on file  Intimate Partner Violence:   . Fear of Current or Ex-Partner: Not on file  . Emotionally Abused: Not on file  . Physically Abused: Not on file  . Sexually Abused: Not on file    Current Outpatient Medications on File Prior to Visit  Medication Sig Dispense Refill  . ketoconazole (NIZORAL) 2 % shampoo USE 3 TIMES PER WEEK ALTERNATING WITH OVER THE COUNTER DANDRUFF SHAMPOO    . nystatin cream (MYCOSTATIN) APPLY TO AFFECTED AREA TWICE A DAY ON FACE AS NEEDED**MIX EQUAL PARTS WITH TRIAMCINOLONE CREAM**     No current facility-administered medications on file prior to visit.    No Known Allergies  Family History  Problem Relation Age of Onset  . High Cholesterol Mother   . Diabetes Mother   . Depression Father   . Diabetes Father   .  High Cholesterol Father   . Diabetes Maternal Grandmother   . Diabetes Maternal Grandfather   . Alcohol abuse Paternal Grandmother   . Diabetes Paternal Grandmother   . Diabetes Paternal Grandfather     BP 122/88   Pulse 97   Ht 5\' 7"  (1.702 m)   Wt 278 lb (126.1 kg)   SpO2 97%   BMI 43.54 kg/m    Review of Systems denies weight loss, blurry vision, chest pain, sob, n/v, urinary frequency, and depression.  He has ED sxs.      Objective:   Physical Exam VITAL SIGNS:  See vs page GENERAL: no distress Pulses: dorsalis pedis intact bilat.   MSK: no deformity of the feet CV: no leg edema Skin:  no ulcer on the feet, but the skin is dry on the soles of the feet.  normal color and temp on the feet.  Neuro: sensation is intact to touch on the feet.    Lab Results   Component Value Date   HGBA1C 6.3 (A) 01/03/2020   Lab Results  Component Value Date   CREATININE 0.83 10/07/2019   BUN 9 10/07/2019   NA 138 10/07/2019   K 3.8 10/07/2019   CL 101 10/07/2019   CO2 23 10/07/2019   I have reviewed outside records, and summarized: Pt was noted to have DM, and referred here.  Very high insulin levels were noted.  He was rx'ed Trulicity, but he stopped due to lack of effect.        Assessment & Plan:  Type 2 DM  ED, new to me Severe insulin resistance: I told pt our goal is to lower insulin levels, so we are maximizing rx Morbid obesity: I told pt we are choosing DM meds which might help this.   Patient Instructions  good diet and exercise significantly improve the control of your diabetes.  please let me know if you wish to be referred to a dietician.  high blood sugar is very risky to your health.  you should see an eye doctor and dentist every year.  It is very important to get all recommended vaccinations.  Controlling your blood pressure and cholesterol drastically reduces the damage diabetes does to your body.  Those who smoke should quit.  Please discuss these with your doctor.  I have sent 4 prescriptions to your pharmacy: to resume the Trulicity, test strips, Viagra, and to start "Invokana."  Please call sooner if you have no nausea, so we can increase the Trulicity.   Please come back for a follow-up appointment in 3 months.

## 2020-01-03 NOTE — Patient Instructions (Addendum)
good diet and exercise significantly improve the control of your diabetes.  please let me know if you wish to be referred to a dietician.  high blood sugar is very risky to your health.  you should see an eye doctor and dentist every year.  It is very important to get all recommended vaccinations.  Controlling your blood pressure and cholesterol drastically reduces the damage diabetes does to your body.  Those who smoke should quit.  Please discuss these with your doctor.  I have sent 4 prescriptions to your pharmacy: to resume the Trulicity, test strips, Viagra, and to start "Invokana."  Please call sooner if you have no nausea, so we can increase the Trulicity.   Please come back for a follow-up appointment in 3 months.

## 2020-01-06 ENCOUNTER — Encounter: Payer: Self-pay | Admitting: Endocrinology

## 2020-01-10 ENCOUNTER — Ambulatory Visit
Admission: RE | Admit: 2020-01-10 | Discharge: 2020-01-10 | Disposition: A | Discharge status: Home / Self Care | Payer: 59 | Source: Ambulatory Visit | Attending: Nurse Practitioner | Admitting: Nurse Practitioner

## 2020-01-10 ENCOUNTER — Other Ambulatory Visit: Payer: Self-pay | Admitting: Nurse Practitioner

## 2020-01-10 DIAGNOSIS — Z021 Encounter for pre-employment examination: Secondary | ICD-10-CM

## 2020-02-22 ENCOUNTER — Telehealth: Payer: Self-pay | Admitting: Endocrinology

## 2020-02-22 MED ORDER — TRULICITY 3 MG/0.5ML ~~LOC~~ SOAJ: 3.0000 mg | SUBCUTANEOUS | 3 refills | Status: DC

## 2020-02-22 NOTE — Telephone Encounter (Signed)
Are you aware of the increase?  Thanks!

## 2020-02-22 NOTE — Telephone Encounter (Signed)
Patient requests a new RX with a dosage change to 3.0 for Trulicity sent to  [id number] - [place], [place] - [address] Phone:  [phone]  Fax:  [phone]

## 2020-02-22 NOTE — Telephone Encounter (Signed)
Ok, I have sent a prescription to your pharmacy 

## 2020-02-23 NOTE — Telephone Encounter (Signed)
Noted. Thanks.

## 2020-03-22 ENCOUNTER — Encounter: Payer: Self-pay | Admitting: Endocrinology

## 2020-03-22 ENCOUNTER — Other Ambulatory Visit: Payer: Self-pay | Admitting: Endocrinology

## 2020-03-22 ENCOUNTER — Other Ambulatory Visit: Payer: Self-pay

## 2020-03-22 ENCOUNTER — Ambulatory Visit (INDEPENDENT_AMBULATORY_CARE_PROVIDER_SITE_OTHER): Payer: BC Managed Care – PPO | Admitting: Endocrinology

## 2020-03-22 VITALS — BP 124/68 | HR 86 | Ht 67.0 in | Wt 263.0 lb

## 2020-03-22 DIAGNOSIS — R7303 Prediabetes: Secondary | ICD-10-CM

## 2020-03-22 DIAGNOSIS — N529 Male erectile dysfunction, unspecified: Secondary | ICD-10-CM | POA: Diagnosis not present

## 2020-03-22 LAB — POCT GLYCOSYLATED HEMOGLOBIN (HGB A1C): Hemoglobin A1C: 5.4 % (ref 4.0–5.6)

## 2020-03-22 MED ORDER — TRULICITY 4.5 MG/0.5ML ~~LOC~~ SOAJ: 4.5000 mg | SUBCUTANEOUS | 3 refills | Status: DC

## 2020-03-22 MED ORDER — CANAGLIFLOZIN 300 MG PO TABS
300.0000 mg | ORAL_TABLET | Freq: Every day | ORAL | 3 refills | Status: DC
Start: 2020-03-22 — End: 2021-07-20

## 2020-03-22 NOTE — Patient Instructions (Addendum)
I have sent a prescription to your pharmacy to increase the Trulicity.   Please continue the Invokana.   A blood test is requested for you today.  We'll let you know about the results.  Please come back for a follow-up appointment in 4 months.

## 2020-03-22 NOTE — Progress Notes (Signed)
Subjective:    Patient ID: Randall Chase, male    DOB: 1985-10-03, 35 y.o.   MRN: [id number]  HPI Pt returns for f/u of diabetes mellitus: DM type: 2 Dx'ed: 2021 Complications: none Therapy: insulin since DKA: never Severe hypoglycemia: never Pancreatitis: never Pancreatic imaging: (2021 CT): Nonspecific mild heterogeneity  SDOH: none Other: goal is to rx obesity, also Interval history: pt states he feels well in general.  Specifically, he denies nausea.  He takes meds as rx'ed.    Past Medical History:  Diagnosis Date  . Elevated liver enzymes   . Obesity, Class III, BMI 40-49.9 (morbid obesity) (HCC)   . Seborrheic dermatitis of scalp   . Tinea versicolor     Past Surgical History:  Procedure Laterality Date  . TOENAIL EXCISION  2012   Ingrown    Social History   Socioeconomic History  . Marital status: Married    Spouse name: Not on file  . Number of children: Not on file  . Years of education: Not on file  . Highest education level: Not on file  Occupational History  . Occupation: Doctor, general practice, EMT  Tobacco Use  . Smoking status: Never Smoker  . Smokeless tobacco: Never Used  Substance and Sexual Activity  . Alcohol use: Never  . Drug use: Never  . Sexual activity: Yes    Partners: Female    Birth control/protection: None  Other Topics Concern  . Not on file  Social History Narrative   Longtime girlfriend, 2 y/o daughter.   Relocated to [place] from [place], [place] [address].  Orig from [place].   College: Assoc degree in [place].   Occup: Data entry, asset recovery.   No T/A/Ds   Social Determinants of Health   Financial Resource Strain: Not on file  Food Insecurity: Not on file  Transportation Needs: Not on file  Physical Activity: Not on file  Stress: Not on file  Social Connections: Not on file  Intimate Partner Violence: Not on file    Current Outpatient Medications on File Prior to Visit  Medication Sig Dispense Refill  . glucose blood  (ONETOUCH VERIO) test strip 1 each by Other route daily. And lancets 1/day 100 each 3  . ketoconazole (NIZORAL) 2 % shampoo USE 3 TIMES PER WEEK ALTERNATING WITH OVER THE COUNTER DANDRUFF SHAMPOO    . nystatin cream (MYCOSTATIN) APPLY TO AFFECTED AREA TWICE A DAY ON FACE AS NEEDED**MIX EQUAL PARTS WITH TRIAMCINOLONE CREAM**     No current facility-administered medications on file prior to visit.    No Known Allergies  Family History  Problem Relation Age of Onset  . High Cholesterol Mother   . Diabetes Mother   . Depression Father   . Diabetes Father   . High Cholesterol Father   . Diabetes Maternal Grandmother   . Diabetes Maternal Grandfather   . Alcohol abuse Paternal Grandmother   . Diabetes Paternal Grandmother   . Diabetes Paternal Grandfather     BP 124/68   Pulse 86   Ht 5\' 7"  (1.702 m)   Wt 263 lb (119.3 kg)   SpO2 99%   BMI 41.19 kg/m   Review of Systems He has lost 15 lbs since last ov.  He has mild ED sxs.      Objective:   Physical Exam VITAL SIGNS:  See vs page GENERAL: no distress Pulses: dorsalis pedis intact bilat.   MSK: no deformity of the feet CV: no leg edema Skin:  no ulcer  on the feet.  normal color and temp on the feet. Neuro: sensation is intact to touch on the feet.     Lab Results  Component Value Date   HGBA1C 5.4 03/22/2020       Assessment & Plan:  Type 2 DM: well-controlled.  Obesity: uncontrolled.   ED: check testosterone.   Patient Instructions  I have sent a prescription to your pharmacy to increase the Trulicity.   Please continue the Invokana.   A blood test is requested for you today.  We'll let you know about the results.  Please come back for a follow-up appointment in 4 months.

## 2020-03-23 ENCOUNTER — Telehealth: Payer: Self-pay | Admitting: Endocrinology

## 2020-03-23 MED ORDER — METFORMIN HCL ER 500 MG PO TB24: 500.0000 mg | ORAL_TABLET | Freq: Every day | ORAL | 3 refills | Status: DC

## 2020-03-23 NOTE — Telephone Encounter (Signed)
OK, I have sent a prescription to your pharmacy, for metformin.

## 2020-03-23 NOTE — Telephone Encounter (Signed)
please contact patient: Ins wants you to try metformin before they will pay for Trulicity.  Have you ever been on this?

## 2020-03-23 NOTE — Telephone Encounter (Signed)
Pt stated --have not tried Metformin

## 2020-03-25 LAB — TESTOSTERONE,FREE AND TOTAL
Testosterone, Free: 7.6 pg/mL — ABNORMAL LOW (ref 8.7–25.1)
Testosterone: 461 ng/dL (ref 264–916)

## 2020-04-07 ENCOUNTER — Ambulatory Visit: Payer: 59 | Admitting: Endocrinology

## 2020-05-14 ENCOUNTER — Encounter: Payer: Self-pay | Admitting: Endocrinology

## 2020-05-14 DIAGNOSIS — R7303 Prediabetes: Secondary | ICD-10-CM

## 2020-05-23 MED ORDER — TRULICITY 0.75 MG/0.5ML ~~LOC~~ SOAJ: 0.7500 mg | SUBCUTANEOUS | 3 refills | Status: DC

## 2020-07-26 ENCOUNTER — Ambulatory Visit: Payer: Medicaid Other | Admitting: Endocrinology

## 2020-07-26 ENCOUNTER — Telehealth: Payer: Self-pay | Admitting: Endocrinology

## 2020-07-26 ENCOUNTER — Other Ambulatory Visit: Payer: Self-pay

## 2020-07-26 VITALS — BP 122/82 | HR 83 | Ht 67.0 in | Wt 267.8 lb

## 2020-07-26 DIAGNOSIS — R7303 Prediabetes: Secondary | ICD-10-CM | POA: Diagnosis not present

## 2020-07-26 DIAGNOSIS — E119 Type 2 diabetes mellitus without complications: Secondary | ICD-10-CM

## 2020-07-26 LAB — POCT GLYCOSYLATED HEMOGLOBIN (HGB A1C): Hemoglobin A1C: 5.5 % (ref 4.0–5.6)

## 2020-07-26 MED ORDER — VICTOZA 18 MG/3ML ~~LOC~~ SOPN: 0.6000 mg | PEN_INJECTOR | Freq: Every day | SUBCUTANEOUS | 11 refills | Status: DC

## 2020-07-26 MED ORDER — METFORMIN HCL ER 500 MG PO TB24
1000.0000 mg | ORAL_TABLET | Freq: Every day | ORAL | 3 refills | Status: DC
Start: 2020-07-26 — End: 2020-07-26

## 2020-07-26 MED ORDER — METFORMIN HCL ER 500 MG PO TB24
1000.0000 mg | ORAL_TABLET | Freq: Every day | ORAL | 3 refills | Status: DC
Start: 2020-07-26 — End: 2020-09-27

## 2020-07-26 NOTE — Progress Notes (Signed)
Subjective:    Patient ID: Randall Chase, male    DOB: [birth year], 35 y.o.   MRN: [id number]  HPI Pt returns for f/u of diabetes mellitus: DM type: 2 Dx'ed: 2021 Complications: none Therapy: Trulicity and 2 oral meds DKA: never Severe hypoglycemia: never Pancreatitis: never Pancreatic imaging: (2021 CT): Nonspecific mild heterogeneity.   SDOH: he now has medicaid.   Other: goal is to rx obesity, also Interval history: he stopped Trulicity 3 mos ago, due to nausea.  He would like to resume at a lower dosage, and to increase metformin.   Past Medical History:  Diagnosis Date  . Elevated liver enzymes   . Obesity, Class III, BMI 40-49.9 (morbid obesity) (HCC)   . Seborrheic dermatitis of scalp   . Tinea versicolor     Past Surgical History:  Procedure Laterality Date  . TOENAIL EXCISION  2012   Ingrown    Social History   Socioeconomic History  . Marital status: Married    Spouse name: Not on file  . Number of children: Not on file  . Years of education: Not on file  . Highest education level: Not on file  Occupational History  . Occupation: Doctor, general practice, EMT  Tobacco Use  . Smoking status: Never Smoker  . Smokeless tobacco: Never Used  Substance and Sexual Activity  . Alcohol use: Never  . Drug use: Never  . Sexual activity: Yes    Partners: Female    Birth control/protection: None  Other Topics Concern  . Not on file  Social History Narrative   Longtime girlfriend, 2 y/o daughter.   Relocated to [place] from [place], [place] [address].  Orig from [place].   College: Assoc degree in [place].   Occup: Data entry, asset recovery.   No T/A/Ds   Social Determinants of Health   Financial Resource Strain: Not on file  Food Insecurity: Not on file  Transportation Needs: Not on file  Physical Activity: Not on file  Stress: Not on file  Social Connections: Not on file  Intimate Partner Violence: Not on file    Current Outpatient Medications on File Prior to Visit   Medication Sig Dispense Refill  . canagliflozin (INVOKANA) 300 MG TABS tablet Take 1 tablet (300 mg total) by mouth daily before breakfast. 90 tablet 3  . glucose blood (ONETOUCH VERIO) test strip 1 each by Other route daily. And lancets 1/day 100 each 3  . ketoconazole (NIZORAL) 2 % shampoo USE 3 TIMES PER WEEK ALTERNATING WITH OVER THE COUNTER DANDRUFF SHAMPOO    . nystatin cream (MYCOSTATIN) APPLY TO AFFECTED AREA TWICE A DAY ON FACE AS NEEDED**MIX EQUAL PARTS WITH TRIAMCINOLONE CREAM**     No current facility-administered medications on file prior to visit.    No Known Allergies  Family History  Problem Relation Age of Onset  . High Cholesterol Mother   . Diabetes Mother   . Depression Father   . Diabetes Father   . High Cholesterol Father   . Diabetes Maternal Grandmother   . Diabetes Maternal Grandfather   . Alcohol abuse Paternal Grandmother   . Diabetes Paternal Grandmother   . Diabetes Paternal Grandfather     BP 122/82 (BP Location: Right Arm, Patient Position: Sitting, Cuff Size: Large)   Pulse 83   Ht 5\' 7"  (1.702 m)   Wt 267 lb 12.8 oz (121.5 kg)   SpO2 96%   BMI 41.94 kg/m    Review of Systems     Objective:  Physical Exam VITAL SIGNS:  See vs page GENERAL: no distress Pulses: dorsalis pedis intact bilat.   MSK: no deformity of the feet CV: no leg edema Skin:  no ulcer on the feet.  normal color and temp on the feet. Neuro: sensation is intact to touch on the feet     Lab Results  Component Value Date   HGBA1C 5.5 07/26/2020       Assessment & Plan:  Type 2 DM: well-controlled Obesity: uncontrolled.  We'll add GLP rx  Patient Instructions  I have sent a prescription to your pharmacy to increase the metformin, and to start Victoza.    Please continue the Invokana.   Please come back for a follow-up appointment in 4 months.

## 2020-07-26 NOTE — Patient Instructions (Addendum)
I have sent a prescription to your pharmacy to increase the metformin, and to start Victoza.    Please continue the Invokana.   Please come back for a follow-up appointment in 4 months.

## 2020-07-26 NOTE — Telephone Encounter (Signed)
His Chart has been updated and the other pharmacy removed.

## 2020-07-26 NOTE — Telephone Encounter (Signed)
At check out Patient stated he only wants to have RX's sent to:  [id number] - [place], [place] - [address] Phone:  [phone]  Fax:  [phone]     [facility] from his [facility] list

## 2020-07-29 DIAGNOSIS — E119 Type 2 diabetes mellitus without complications: Secondary | ICD-10-CM | POA: Insufficient documentation

## 2020-09-27 ENCOUNTER — Ambulatory Visit: Payer: Medicaid Other | Admitting: Endocrinology

## 2020-09-27 ENCOUNTER — Other Ambulatory Visit: Payer: Self-pay

## 2020-09-27 VITALS — BP 158/88 | HR 67 | Ht 67.0 in | Wt 262.0 lb

## 2020-09-27 DIAGNOSIS — E119 Type 2 diabetes mellitus without complications: Secondary | ICD-10-CM

## 2020-09-27 LAB — POCT GLYCOSYLATED HEMOGLOBIN (HGB A1C): Hemoglobin A1C: 5.4 % (ref 4.0–5.6)

## 2020-09-27 MED ORDER — METFORMIN HCL ER 500 MG PO TB24
500.0000 mg | ORAL_TABLET | Freq: Every day | ORAL | 3 refills | Status: DC
Start: 2020-09-27 — End: 2021-07-20

## 2020-09-27 NOTE — Patient Instructions (Addendum)
Your blood pressure is high today.  Please see your primary care provider soon, to have it rechecked.   I have sent a prescription to your pharmacy to reduce the metformin.   Please continue the Invokana.   Please stay off Trulicity and Victoza.  Please come back for a follow-up appointment in 3 months.

## 2020-09-27 NOTE — Progress Notes (Signed)
Subjective:    Patient ID: Randall Chase, male    DOB: December 10, 1985, 35 y.o.   MRN: [id number]  HPI Pt returns for f/u of diabetes mellitus:  DM type: 2 Dx'ed: 2021 Complications: none Therapy: Trulicity and 2 oral meds.   DKA: never Severe hypoglycemia: never Pancreatitis: never Pancreatic imaging: (2021 CT): Nonspecific mild heterogeneity.   SDOH: he now has medicaid.   Other: goal is to rx obesity, also.   Interval history: He says cbg's are well-controlled.  He is still using up Trulicity before starting Victoza.  However, he has nausea and diarrhea.   Past Medical History:  Diagnosis Date   Elevated liver enzymes    Obesity, Class III, BMI 40-49.9 (morbid obesity) (HCC)    Seborrheic dermatitis of scalp    Tinea versicolor     Past Surgical History:  Procedure Laterality Date   TOENAIL EXCISION  2012   Ingrown    Social History   Socioeconomic History   Marital status: Married    Spouse name: Not on file   Number of children: Not on file   Years of education: Not on file   Highest education level: Not on file  Occupational History   Occupation: Doctor, general practice, EMT  Tobacco Use   Smoking status: Never   Smokeless tobacco: Never  Substance and Sexual Activity   Alcohol use: Never   Drug use: Never   Sexual activity: Yes    Partners: Female    Birth control/protection: None  Other Topics Concern   Not on file  Social History Narrative   Longtime girlfriend, 2 y/o daughter.   Relocated to [place] from [place], [place] [address].  Orig from [place].   College: Assoc degree in [place].   Occup: Data entry, asset recovery.   No T/A/Ds   Social Determinants of Health   Financial Resource Strain: Not on file  Food Insecurity: Not on file  Transportation Needs: Not on file  Physical Activity: Not on file  Stress: Not on file  Social Connections: Not on file  Intimate Partner Violence: Not on file    Current Outpatient Medications on File Prior to Visit  Medication  Sig Dispense Refill   canagliflozin (INVOKANA) 300 MG TABS tablet Take 1 tablet (300 mg total) by mouth daily before breakfast. 90 tablet 3   glucose blood (ONETOUCH VERIO) test strip 1 each by Other route daily. And lancets 1/day 100 each 3   ketoconazole (NIZORAL) 2 % shampoo USE 3 TIMES PER WEEK ALTERNATING WITH OVER THE COUNTER DANDRUFF SHAMPOO     nystatin cream (MYCOSTATIN) APPLY TO AFFECTED AREA TWICE A DAY ON FACE AS NEEDED**MIX EQUAL PARTS WITH TRIAMCINOLONE CREAM**     No current facility-administered medications on file prior to visit.    No Known Allergies  Family History  Problem Relation Age of Onset   High Cholesterol Mother    Diabetes Mother    Depression Father    Diabetes Father    High Cholesterol Father    Diabetes Maternal Grandmother    Diabetes Maternal Grandfather    Alcohol abuse Paternal Grandmother    Diabetes Paternal Grandmother    Diabetes Paternal Grandfather     BP (!) 158/88 (BP Location: Left Arm, Patient Position: Sitting, Cuff Size: Normal)   Pulse 67   Ht 5\' 7"  (1.702 m)   Wt 262 lb (118.8 kg)   SpO2 97%   BMI 41.04 kg/m    Review of Systems     Objective:  Physical Exam Pulses: dorsalis pedis intact bilat.   MSK: no deformity of the feet CV: 1+ bilat leg edema Skin:  no ulcer on the feet.  normal color and temp on the feet. Neuro: sensation is intact to touch on the feet Ext: there is bilateral onychomycosis of the toenails  A1c=5.4%     Assessment & Plan:  Type 2 DM: well-controlled N/D, due to metformin.    Patient Instructions  Your blood pressure is high today.  Please see your primary care provider soon, to have it rechecked.   I have sent a prescription to your pharmacy to reduce the metformin.   Please continue the Invokana.   Please stay off Trulicity and Victoza.  Please come back for a follow-up appointment in 3 months.

## 2020-12-28 ENCOUNTER — Other Ambulatory Visit: Payer: Self-pay

## 2020-12-28 ENCOUNTER — Ambulatory Visit (INDEPENDENT_AMBULATORY_CARE_PROVIDER_SITE_OTHER): Payer: 59 | Admitting: Endocrinology

## 2020-12-28 VITALS — BP 130/84 | HR 81 | Ht 67.0 in | Wt 288.7 lb

## 2020-12-28 DIAGNOSIS — G479 Sleep disorder, unspecified: Secondary | ICD-10-CM | POA: Diagnosis not present

## 2020-12-28 DIAGNOSIS — E119 Type 2 diabetes mellitus without complications: Secondary | ICD-10-CM | POA: Diagnosis not present

## 2020-12-28 LAB — POCT GLYCOSYLATED HEMOGLOBIN (HGB A1C): Hemoglobin A1C: 5.4 % (ref 4.0–5.6)

## 2020-12-28 MED ORDER — TIRZEPATIDE 2.5 MG/0.5ML ~~LOC~~ SOAJ: 2.5000 mg | SUBCUTANEOUS | 3 refills | Status: DC

## 2020-12-28 NOTE — Progress Notes (Signed)
Subjective:    Patient ID: Randall Chase, male    DOB: 24-Jan-1986, 35 y.o.   MRN: [id number]  HPI Pt returns for f/u of diabetes mellitus:  DM type: 2 Dx'ed: 2021 Complications: none Therapy: Trulicity and 2 oral meds.   DKA: never Severe hypoglycemia: never Pancreatitis: never Pancreatic imaging: (2021 CT): Nonspecific mild heterogeneity.   SDOH: he now has health ins.    Other: goal is to rx obesity, also; Trulicity caused nausea and diarrhea.   Interval history: He has not recently taken DM meds.  pt states he feels well in general. Past Medical History:  Diagnosis Date   Elevated liver enzymes    Obesity, Class III, BMI 40-49.9 (morbid obesity) (HCC)    Seborrheic dermatitis of scalp    Tinea versicolor     Past Surgical History:  Procedure Laterality Date   TOENAIL EXCISION  2012   Ingrown    Social History   Socioeconomic History   Marital status: Married    Spouse name: Not on file   Number of children: Not on file   Years of education: Not on file   Highest education level: Not on file  Occupational History   Occupation: Doctor, general practice, EMT  Tobacco Use   Smoking status: Never   Smokeless tobacco: Never  Substance and Sexual Activity   Alcohol use: Never   Drug use: Never   Sexual activity: Yes    Partners: Female    Birth control/protection: None  Other Topics Concern   Not on file  Social History Narrative   Longtime girlfriend, 2 y/o daughter.   Relocated to [place] from [place], [place] [address].  Orig from [place].   College: Assoc degree in [place].   Occup: Data entry, asset recovery.   No T/A/Ds   Social Determinants of Health   Financial Resource Strain: Not on file  Food Insecurity: Not on file  Transportation Needs: Not on file  Physical Activity: Not on file  Stress: Not on file  Social Connections: Not on file  Intimate Partner Violence: Not on file    Current Outpatient Medications on File Prior to Visit  Medication Sig Dispense Refill    canagliflozin (INVOKANA) 300 MG TABS tablet Take 1 tablet (300 mg total) by mouth daily before breakfast. 90 tablet 3   glucose blood (ONETOUCH VERIO) test strip 1 each by Other route daily. And lancets 1/day 100 each 3   ketoconazole (NIZORAL) 2 % shampoo USE 3 TIMES PER WEEK ALTERNATING WITH OVER THE COUNTER DANDRUFF SHAMPOO     metFORMIN (GLUCOPHAGE-XR) 500 MG 24 hr tablet Take 1 tablet (500 mg total) by mouth daily with breakfast. 90 tablet 3   nystatin cream (MYCOSTATIN) APPLY TO AFFECTED AREA TWICE A DAY ON FACE AS NEEDED**MIX EQUAL PARTS WITH TRIAMCINOLONE CREAM**     No current facility-administered medications on file prior to visit.    No Known Allergies  Family History  Problem Relation Age of Onset   High Cholesterol Mother    Diabetes Mother    Depression Father    Diabetes Father    High Cholesterol Father    Diabetes Maternal Grandmother    Diabetes Maternal Grandfather    Alcohol abuse Paternal Grandmother    Diabetes Paternal Grandmother    Diabetes Paternal Grandfather     BP 130/84 (BP Location: Left Arm, Patient Position: Sitting, Cuff Size: Large)   Pulse 81   Ht 5\' 7"  (1.702 m)   Wt 288 lb 11.2 oz (  131 kg)   SpO2 97%   BMI 45.22 kg/m    Review of Systems He reports snoring    Objective:   Physical Exam   Lab Results  Component Value Date   HGBA1C 5.4 12/28/2020       Assessment & Plan:  Type 2 DM: well-controlled Obesity: uncontrolled  Patient Instructions  I have sent a prescription to your pharmacy, to add "Mounjaro." Please continue the Invokana and metformin.  Please see a sleep specialist.  you will receive a phone call, about a day and time for an appointment Please come back for a follow-up appointment in 3 months.

## 2020-12-28 NOTE — Patient Instructions (Addendum)
I have sent a prescription to your pharmacy, to add "Mounjaro." Please continue the Invokana and metformin.  Please see a sleep specialist.  you will receive a phone call, about a day and time for an appointment Please come back for a follow-up appointment in 3 months.

## 2020-12-29 ENCOUNTER — Telehealth: Payer: Self-pay | Admitting: Pharmacy Technician

## 2020-12-29 NOTE — Telephone Encounter (Signed)
Patient Advocate Encounter  Received notification from [employer] that prior authorization for [facility] 2.5MG  is required.   PA submitted on 10.14.22 Key [id number] Status is pending   [facility] will continue to follow  Ricke Hey, CPhT Patient Advocate  Endocrinology Phone: [phone] Fax:  [phone]

## 2021-01-02 NOTE — Telephone Encounter (Signed)
Received notification from [employer] regarding a prior authorization for [facility]. Authorization has been APPROVED from 10.14.22 to 10.14.23.    Authorization # [id number]

## 2021-02-05 ENCOUNTER — Telehealth: Payer: Self-pay | Admitting: Endocrinology

## 2021-02-05 ENCOUNTER — Other Ambulatory Visit: Payer: Self-pay | Admitting: Endocrinology

## 2021-02-05 MED ORDER — TIRZEPATIDE 5 MG/0.5ML ~~LOC~~ SOAJ: 5.0000 mg | SUBCUTANEOUS | 3 refills | Status: DC

## 2021-02-05 NOTE — Telephone Encounter (Signed)
Message sent thru MyChart 

## 2021-02-05 NOTE — Telephone Encounter (Signed)
Pt request dosage increase for tirzepatide [facility]) 2.5 MG/0.5ML Pen be sent to  [id number] - [place], [place] - [address] Phone:  [phone]  Fax:  [phone]

## 2021-04-04 ENCOUNTER — Ambulatory Visit: Payer: 59 | Admitting: Endocrinology

## 2021-04-20 ENCOUNTER — Other Ambulatory Visit: Payer: Self-pay

## 2021-04-20 ENCOUNTER — Ambulatory Visit: Payer: 59 | Admitting: Endocrinology

## 2021-04-20 VITALS — BP 110/64 | HR 92 | Ht 67.0 in | Wt 275.2 lb

## 2021-04-20 DIAGNOSIS — E119 Type 2 diabetes mellitus without complications: Secondary | ICD-10-CM

## 2021-04-20 LAB — POCT GLYCOSYLATED HEMOGLOBIN (HGB A1C): Hemoglobin A1C: 5.3 % (ref 4.0–5.6)

## 2021-04-20 MED ORDER — TIRZEPATIDE 7.5 MG/0.5ML ~~LOC~~ SOAJ: 7.5000 mg | SUBCUTANEOUS | 3 refills | Status: DC

## 2021-04-20 NOTE — Progress Notes (Signed)
Subjective:    Patient ID: Randall Chase, male    DOB: 1985-07-05, 36 y.o.   MRN: [id number]  HPI Pt returns for f/u of diabetes mellitus:  DM type: 2 Dx'ed: 2021 Complications: none Therapy: Mounjaro and 2 oral meds.   DKA: never Severe hypoglycemia: never Pancreatitis: never Pancreatic imaging: (2021 CT): mild heterogeneity.   SDOH: he works [employer], M-F.   Other: goal is weight loss, also; Trulicity caused nausea and diarrhea.   Interval history: He has not recently taken DM meds.  pt states he feels well in general.   Past Medical History:  Diagnosis Date   Elevated liver enzymes    Obesity, Class III, BMI 40-49.9 (morbid obesity) (HCC)    Seborrheic dermatitis of scalp    Tinea versicolor     Past Surgical History:  Procedure Laterality Date   TOENAIL EXCISION  2012   Ingrown    Social History   Socioeconomic History   Marital status: Married    Spouse name: Not on file   Number of children: Not on file   Years of education: Not on file   Highest education level: Not on file  Occupational History   Occupation: Doctor, general practice, EMT  Tobacco Use   Smoking status: Never   Smokeless tobacco: Never  Substance and Sexual Activity   Alcohol use: Never   Drug use: Never   Sexual activity: Yes    Partners: Female    Birth control/protection: None  Other Topics Concern   Not on file  Social History Narrative   Longtime girlfriend, 2 y/o daughter.   Relocated to [place] from [place], [place] [address].  Orig from [place].   College: Assoc degree in [place].   Occup: Data entry, asset recovery.   No T/A/Ds   Social Determinants of Health   Financial Resource Strain: Not on file  Food Insecurity: Not on file  Transportation Needs: Not on file  Physical Activity: Not on file  Stress: Not on file  Social Connections: Not on file  Intimate Partner Violence: Not on file    Current Outpatient Medications on File Prior to Visit  Medication Sig Dispense Refill    canagliflozin (INVOKANA) 300 MG TABS tablet Take 1 tablet (300 mg total) by mouth daily before breakfast. 90 tablet 3   glucose blood (ONETOUCH VERIO) test strip 1 each by Other route daily. And lancets 1/day 100 each 3   ketoconazole (NIZORAL) 2 % shampoo USE 3 TIMES PER WEEK ALTERNATING WITH OVER THE COUNTER DANDRUFF SHAMPOO     metFORMIN (GLUCOPHAGE-XR) 500 MG 24 hr tablet Take 1 tablet (500 mg total) by mouth daily with breakfast. 90 tablet 3   nystatin cream (MYCOSTATIN) APPLY TO AFFECTED AREA TWICE A DAY ON FACE AS NEEDED**MIX EQUAL PARTS WITH TRIAMCINOLONE CREAM**     No current facility-administered medications on file prior to visit.    No Known Allergies  Family History  Problem Relation Age of Onset   High Cholesterol Mother    Diabetes Mother    Depression Father    Diabetes Father    High Cholesterol Father    Diabetes Maternal Grandmother    Diabetes Maternal Grandfather    Alcohol abuse Paternal Grandmother    Diabetes Paternal Grandmother    Diabetes Paternal Grandfather     BP 110/64    Pulse 92    Ht 5\' 7"  (1.702 m)    Wt 275 lb 3.2 oz (124.8 kg)    SpO2 98%  BMI 43.10 kg/m    Review of Systems He has lost 15 lbs since last ov.  He has mild nausea for 2 days after each dose of Mounjaro.      Objective:   Physical Exam    A1c=5.3%    Assessment & Plan:  Type 2 DM: well-controlled Obesity: uncontrolled.   Patient Instructions  I have sent a prescription to your pharmacy, to increase the [facility].   Please continue the Invokana and metformin.   Please come back for a follow-up appointment in 3 months.

## 2021-04-20 NOTE — Patient Instructions (Addendum)
I have sent a prescription to your pharmacy, to increase the [facility].   Please continue the Invokana and metformin.   Please come back for a follow-up appointment in 3 months.

## 2021-07-20 ENCOUNTER — Encounter: Payer: Self-pay | Admitting: Endocrinology

## 2021-07-20 ENCOUNTER — Ambulatory Visit: Payer: 59 | Admitting: Endocrinology

## 2021-07-20 VITALS — BP 128/82 | Ht 67.0 in | Wt 238.0 lb

## 2021-07-20 DIAGNOSIS — E119 Type 2 diabetes mellitus without complications: Secondary | ICD-10-CM

## 2021-07-20 DIAGNOSIS — E669 Obesity, unspecified: Secondary | ICD-10-CM

## 2021-07-20 LAB — POCT GLYCOSYLATED HEMOGLOBIN (HGB A1C): Hemoglobin A1C: 5.3 % (ref 4.0–5.6)

## 2021-07-20 MED ORDER — TIRZEPATIDE 10 MG/0.5ML ~~LOC~~ SOAJ: 10.0000 mg | SUBCUTANEOUS | 1 refills | Status: DC

## 2021-07-20 NOTE — Patient Instructions (Addendum)
I have sent a prescription to your pharmacy, to increase the [facility].   ?Please come back for a follow-up appointment in 3 months.   ?

## 2021-07-20 NOTE — Progress Notes (Signed)
? ?Subjective:  ? ? Patient ID: Randall Chase, male    DOB: [birth year], 36 y.o.   MRN: [id number] ? ?HPI ?Pt returns for f/u of diabetes mellitus:  ?DM type: 2 ?Dx'ed: 2021 ?Complications: none ?Therapy: Mounjaro   ?DKA: never ?Severe hypoglycemia: never ?Pancreatitis: never ?Pancreatic imaging: (2021 CT): mild heterogeneity.   ?SDOH: he works [employer], M-F.   ?Other: goal is weight loss, also; Trulicity caused nausea and diarrhea.   ?Interval history: He takes Mounjaro as rx'ed.  However, he has not recently taken Invokana or metformin, due to lack of effect.  pt states he feels well in general.   ?Past Medical History:  ?Diagnosis Date  ? Elevated liver enzymes   ? Obesity, Class III, BMI 40-49.9 (morbid obesity) (HCC)   ? Seborrheic dermatitis of scalp   ? Tinea versicolor   ? ? ?Past Surgical History:  ?Procedure Laterality Date  ? TOENAIL EXCISION  2012  ? Ingrown  ? ? ?Social History  ? ?Socioeconomic History  ? Marital status: Married  ?  Spouse name: Not on file  ? Number of children: Not on file  ? Years of education: Not on file  ? Highest education level: Not on file  ?Occupational History  ? Occupation: Doctor, general practice, EMT  ?Tobacco Use  ? Smoking status: Never  ? Smokeless tobacco: Never  ?Substance and Sexual Activity  ? Alcohol use: Never  ? Drug use: Never  ? Sexual activity: Yes  ?  Partners: Female  ?  Birth control/protection: None  ?Other Topics Concern  ? Not on file  ?Social History Narrative  ? Longtime girlfriend, 2 y/o daughter.  ? Relocated to [place] from [place], [place] [address].  Orig from [place].  ? College: Assoc degree in [place].  ? Occup: Data entry, asset recovery.  ? No T/A/Ds  ? ?Social Determinants of Health  ? ?Financial Resource Strain: Not on file  ?Food Insecurity: Not on file  ?Transportation Needs: Not on file  ?Physical Activity: Not on file  ?Stress: Not on file  ?Social Connections: Not on file  ?Intimate Partner Violence: Not on file  ? ? ?Current Outpatient Medications on File  Prior to Visit  ?Medication Sig Dispense Refill  ? glucose blood (ONETOUCH VERIO) test strip 1 each by Other route daily. And lancets 1/day 100 each 3  ? ketoconazole (NIZORAL) 2 % shampoo USE 3 TIMES PER WEEK ALTERNATING WITH OVER THE COUNTER DANDRUFF SHAMPOO    ? nystatin cream (MYCOSTATIN) APPLY TO AFFECTED AREA TWICE A DAY ON FACE AS NEEDED**MIX EQUAL PARTS WITH TRIAMCINOLONE CREAM**    ? ?No current facility-administered medications on file prior to visit.  ? ? ?No Known Allergies ? ?Family History  ?Problem Relation Age of Onset  ? High Cholesterol Mother   ? Diabetes Mother   ? Depression Father   ? Diabetes Father   ? High Cholesterol Father   ? Diabetes Maternal Grandmother   ? Diabetes Maternal Grandfather   ? Alcohol abuse Paternal Grandmother   ? Diabetes Paternal Grandmother   ? Diabetes Paternal Grandfather   ? ? ?BP 128/82   Ht 5\' 7"  (1.702 m)   Wt 238 lb (108 kg)   BMI 37.28 kg/m?  ? ? ?Review of Systems ?N/HB are mild now.   ?   ?Objective:  ? Physical Exam ?VITAL SIGNS:  See vs page ?GENERAL: no distress ? ? ?A1c=5.3% ?   ?Assessment & Plan:  ?Type 2 DM: well-controlled ?Obesity: uncontrolled ? ?  Patient Instructions  ?I have sent a prescription to your pharmacy, to increase the [facility].   ?Please come back for a follow-up appointment in 3 months.   ? ? ?

## 2021-10-18 ENCOUNTER — Other Ambulatory Visit: Payer: Self-pay | Admitting: Endocrinology

## 2021-10-18 DIAGNOSIS — E119 Type 2 diabetes mellitus without complications: Secondary | ICD-10-CM

## 2021-10-18 DIAGNOSIS — E782 Mixed hyperlipidemia: Secondary | ICD-10-CM

## 2021-10-19 ENCOUNTER — Other Ambulatory Visit (INDEPENDENT_AMBULATORY_CARE_PROVIDER_SITE_OTHER): Payer: 59

## 2021-10-19 DIAGNOSIS — E119 Type 2 diabetes mellitus without complications: Secondary | ICD-10-CM | POA: Diagnosis not present

## 2021-10-19 DIAGNOSIS — E782 Mixed hyperlipidemia: Secondary | ICD-10-CM

## 2021-10-19 LAB — LIPID PANEL
Cholesterol: 157 mg/dL (ref 0–200)
HDL: 33.6 mg/dL — ABNORMAL LOW (ref >39.00)
NonHDL: 123.12
Total CHOL/HDL Ratio: 5
Triglycerides: 211 mg/dL — ABNORMAL HIGH (ref 0.0–149.0)
VLDL: 42.2 mg/dL — ABNORMAL HIGH (ref 0.0–40.0)

## 2021-10-19 LAB — COMPREHENSIVE METABOLIC PANEL
ALT: 21 U/L (ref 0–53)
AST: 19 U/L (ref 0–37)
Albumin: 4.7 g/dL (ref 3.5–5.2)
Alkaline Phosphatase: 82 U/L (ref 39–117)
BUN: 10 mg/dL (ref 6–23)
CO2: 27 mEq/L (ref 19–32)
Calcium: 9.4 mg/dL (ref 8.4–10.5)
Chloride: 104 mEq/L (ref 96–112)
Creatinine, Ser: 0.93 mg/dL (ref 0.40–1.50)
GFR: 105.72 mL/min (ref >60.00)
Glucose, Bld: 85 mg/dL (ref 70–99)
Potassium: 3.9 mEq/L (ref 3.5–5.1)
Sodium: 138 mEq/L (ref 135–145)
Total Bilirubin: 0.5 mg/dL (ref 0.2–1.2)
Total Protein: 8.3 g/dL (ref 6.0–8.3)

## 2021-10-19 LAB — MICROALBUMIN / CREATININE URINE RATIO
Creatinine,U: 187.1 mg/dL
Microalb Creat Ratio: 1.6 mg/g (ref 0.0–30.0)
Microalb, Ur: 3 mg/dL — ABNORMAL HIGH (ref 0.0–1.9)

## 2021-10-19 LAB — HEMOGLOBIN A1C: Hgb A1c MFr Bld: 5.2 % (ref 4.6–6.5)

## 2021-10-19 LAB — LDL CHOLESTEROL, DIRECT: Direct LDL: 104 mg/dL

## 2021-10-20 LAB — FRUCTOSAMINE: Fructosamine: 226 umol/L (ref 0–285)

## 2021-10-24 ENCOUNTER — Encounter (INDEPENDENT_AMBULATORY_CARE_PROVIDER_SITE_OTHER): Payer: Self-pay

## 2021-10-26 ENCOUNTER — Encounter: Payer: Self-pay | Admitting: Endocrinology

## 2021-10-26 ENCOUNTER — Ambulatory Visit (INDEPENDENT_AMBULATORY_CARE_PROVIDER_SITE_OTHER): Payer: 59 | Admitting: Endocrinology

## 2021-10-26 VITALS — BP 110/80 | HR 82 | Ht 68.0 in | Wt 238.4 lb

## 2021-10-26 DIAGNOSIS — E782 Mixed hyperlipidemia: Secondary | ICD-10-CM | POA: Diagnosis not present

## 2021-10-26 DIAGNOSIS — R7303 Prediabetes: Secondary | ICD-10-CM | POA: Diagnosis not present

## 2021-10-26 DIAGNOSIS — K760 Fatty (change of) liver, not elsewhere classified: Secondary | ICD-10-CM

## 2021-10-26 MED ORDER — TIRZEPATIDE 15 MG/0.5ML ~~LOC~~ SOAJ: 15.0000 mg | SUBCUTANEOUS | 1 refills | Status: DC

## 2021-10-26 MED ORDER — ROSUVASTATIN CALCIUM 5 MG PO TABS: 5.0000 mg | ORAL_TABLET | Freq: Every day | ORAL | 3 refills | Status: DC

## 2021-10-26 NOTE — Patient Instructions (Signed)
Exercise more 

## 2021-10-26 NOTE — Progress Notes (Unsigned)
Patient ID: Randall Chase, male   DOB: 1985-07-09, 36 y.o.   MRN: [id number]           Reason for Appointment: Type II Diabetes follow-up   History of Present Illness   Diagnosis date: 2021  Previous history: 136 Non-insulin hypoglycemic drugs previously used: Trulicity, Invokana, Victoza, metformin Insulin was started in  A1c range in the last few years is:5.3-6.3  Recent history:     Non-insulin hypoglycemic drugs: Mounjaro 10 mg weekly     Insulin regimen:            Side effects from medications: None  Current self management, blood sugar patterns and problems identified:  A1c is  Exercise: none  Diet management: sweets     Hypoglycemia:  none    Glucometer: One Touch.           Blood Glucose readings from meter download:   PRE-MEAL Fasting Lunch Dinner Bedtime Overall  Glucose range:       Mean/median:        POST-MEAL PC Breakfast PC Lunch PC Dinner  Glucose range:     Mean/median:       Dietician visit: Most recent:      Weight control: 270  Wt Readings from Last 3 Encounters:  10/26/21 238 lb 6.4 oz (108.1 kg)  07/20/21 238 lb (108 kg)  04/20/21 275 lb 3.2 oz (124.8 kg)         BMI 34  Diabetes labs:  Lab Results  Component Value Date   HGBA1C 5.2 10/19/2021   HGBA1C 5.3 07/20/2021   HGBA1C 5.3 04/20/2021   Lab Results  Component Value Date   MICROALBUR 3.0 (H) 10/19/2021   CREATININE 0.93 10/19/2021    Lab Results  Component Value Date   FRUCTOSAMINE 226 10/19/2021     Allergies as of 10/26/2021   No Known Allergies      Medication List        Accurate as of October 26, 2021 11:58 AM. If you have any questions, ask your nurse or doctor.          ketoconazole 2 % shampoo Commonly known as: NIZORAL USE 3 TIMES PER WEEK ALTERNATING WITH OVER THE COUNTER DANDRUFF SHAMPOO   nystatin cream Commonly known as: MYCOSTATIN APPLY TO AFFECTED AREA TWICE A DAY ON FACE AS NEEDED**MIX EQUAL PARTS WITH TRIAMCINOLONE  CREAM**   OneTouch Verio test strip Generic drug: glucose blood 1 each by Other route daily. And lancets 1/day   tirzepatide 10 MG/0.5ML Pen Commonly known as: MOUNJARO Inject 10 mg into the skin once a week.        Allergies: No Known Allergies  Past Medical History:  Diagnosis Date   Elevated liver enzymes    Obesity, Class III, BMI 40-49.9 (morbid obesity) (HCC)    Seborrheic dermatitis of scalp    Tinea versicolor     Past Surgical History:  Procedure Laterality Date   TOENAIL EXCISION  2012   Ingrown    Family History  Problem Relation Age of Onset   High Cholesterol Mother    Diabetes Mother    Depression Father    Diabetes Father    High Cholesterol Father    Diabetes Maternal Grandmother    Diabetes Maternal Grandfather    Alcohol abuse Paternal Grandmother    Diabetes Paternal Grandmother    Diabetes Paternal Grandfather     Social History:  reports that he has never smoked. He has never used smokeless tobacco. He  reports that he does not drink alcohol and does not use drugs.  Review of Systems:  Last diabetic eye exam date  Last urine microalbumin date:  Last foot exam date:  Symptoms of neuropathy:  Hypertension:   ACE/ARB medication:  BP Readings from Last 3 Encounters:  10/26/21 110/80  07/20/21 128/82  04/20/21 110/64    Lipid management:    Lab Results  Component Value Date   CHOL 157 10/19/2021   CHOL 174 06/11/2019   Lab Results  Component Value Date   HDL 33.60 (L) 10/19/2021   HDL 31.90 (L) 06/11/2019   No results found for: "LDLCALC" Lab Results  Component Value Date   TRIG 211.0 (H) 10/19/2021   TRIG 235.0 (H) 06/11/2019   Lab Results  Component Value Date   CHOLHDL 5 10/19/2021   CHOLHDL 5 06/11/2019   Lab Results  Component Value Date   LDLDIRECT 104.0 10/19/2021   LDLDIRECT 115.0 06/11/2019     Examination:   BP 110/80   Pulse 82   Ht 5\' 8"  (1.727 m)   Wt 238 lb 6.4 oz (108.1 kg)   SpO2 97%    BMI 36.25 kg/m   Body mass index is 36.25 kg/m.    ASSESSMENT/ PLAN:    Diabetes type 2:   Current regimen:  See history of present illness for detailed discussion of current diabetes management, blood sugar patterns and problems identified  A1c is  Recommendations:  15 mg Mounjaro Need to start regular aerobic exercise  Hepatic steatosis: Liver function abnormality resolved  Mixed hyperlipidemia: Especially with LDL over 100 he will benefit from statin therapy and will start with 5 mg of rosuvastatin Will do fasting lipids on the next visit  There are no Patient Instructions on file for this visit.   10/26/2021, 11:58 AM

## 2021-10-28 DIAGNOSIS — K760 Fatty (change of) liver, not elsewhere classified: Secondary | ICD-10-CM | POA: Insufficient documentation

## 2021-12-05 ENCOUNTER — Other Ambulatory Visit (HOSPITAL_COMMUNITY): Payer: Self-pay

## 2022-01-11 ENCOUNTER — Other Ambulatory Visit: Payer: Self-pay

## 2022-02-01 ENCOUNTER — Other Ambulatory Visit (INDEPENDENT_AMBULATORY_CARE_PROVIDER_SITE_OTHER): Payer: 59

## 2022-02-01 DIAGNOSIS — R7303 Prediabetes: Secondary | ICD-10-CM

## 2022-02-01 DIAGNOSIS — E782 Mixed hyperlipidemia: Secondary | ICD-10-CM | POA: Diagnosis not present

## 2022-02-01 LAB — COMPREHENSIVE METABOLIC PANEL
ALT: 15 U/L (ref 0–53)
AST: 17 U/L (ref 0–37)
Albumin: 4.3 g/dL (ref 3.5–5.2)
Alkaline Phosphatase: 77 U/L (ref 39–117)
BUN: 11 mg/dL (ref 6–23)
CO2: 28 mEq/L (ref 19–32)
Calcium: 9.4 mg/dL (ref 8.4–10.5)
Chloride: 104 mEq/L (ref 96–112)
Creatinine, Ser: 0.91 mg/dL (ref 0.40–1.50)
GFR: 108.29 mL/min (ref >60.00)
Glucose, Bld: 101 mg/dL — ABNORMAL HIGH (ref 70–99)
Potassium: 3.6 mEq/L (ref 3.5–5.1)
Sodium: 138 mEq/L (ref 135–145)
Total Bilirubin: 0.4 mg/dL (ref 0.2–1.2)
Total Protein: 7.8 g/dL (ref 6.0–8.3)

## 2022-02-01 LAB — LDL CHOLESTEROL, DIRECT: Direct LDL: 94 mg/dL

## 2022-02-01 LAB — LIPID PANEL
Cholesterol: 141 mg/dL (ref 0–200)
HDL: 34 mg/dL — ABNORMAL LOW (ref >39.00)
NonHDL: 107.3
Total CHOL/HDL Ratio: 4
Triglycerides: 280 mg/dL — ABNORMAL HIGH (ref 0.0–149.0)
VLDL: 56 mg/dL — ABNORMAL HIGH (ref 0.0–40.0)

## 2022-02-01 LAB — HEMOGLOBIN A1C: Hgb A1c MFr Bld: 5.1 % (ref 4.6–6.5)

## 2022-02-15 ENCOUNTER — Telehealth: Payer: Self-pay | Admitting: Endocrinology

## 2022-02-15 ENCOUNTER — Ambulatory Visit: Payer: 59 | Admitting: Endocrinology

## 2022-02-15 ENCOUNTER — Encounter: Payer: Self-pay | Admitting: Endocrinology

## 2022-02-15 VITALS — BP 138/88 | HR 90 | Ht 69.0 in | Wt 224.2 lb

## 2022-02-15 DIAGNOSIS — K760 Fatty (change of) liver, not elsewhere classified: Secondary | ICD-10-CM | POA: Diagnosis not present

## 2022-02-15 DIAGNOSIS — R7303 Prediabetes: Secondary | ICD-10-CM

## 2022-02-15 DIAGNOSIS — E782 Mixed hyperlipidemia: Secondary | ICD-10-CM

## 2022-02-15 NOTE — Telephone Encounter (Signed)
Patient requested that the prescriptions for [facility] & Rosuvastatin be sent to his pharmacy of record for 90 day supply.

## 2022-02-15 NOTE — Progress Notes (Unsigned)
Patient ID: Randall Chase, male   DOB: [birth year], 36 y.o.   MRN: [id number]           Reason for Appointment: Endocrine follow-up  History of Present Illness   Diagnosis: Prediabetes, diagnosed in 2021  Previous history: His highest fasting glucose has been 136 as of 06/2019 and highest A1c 6.3 Non-insulin hypoglycemic drugs previously used: Trulicity, Invokana, Victoza, metformin  A1c range in the last few years is:5.3-6.3  Recent history:     Non-insulin hypoglycemic drugs: Mounjaro 15 mg weekly          Side effects from medications: Trulicity caused nausea and diarrhea  Current self management, blood sugar patterns and problems identified:  A1c is 5.2 and about the same He has been treated with diabetes medicines but more recently has been on Mounjaro since 11/22, previously intolerant to Trulicity Although initially had some nausea with Greggory Keen this is subsequently not present Also he has had difficulty getting his 10 mg prescription filled consistently and recently on his own took 2 shots of the 7.5 mg Mounjaro He says that 10 mg Mounjaro does not control his appetite and he still has cravings for sweets and carbohydrates  He is not doing any formal exercise, just a little walking at work Recently weight loss appears to have stalled, previously has lost nearly 40 pounds  Exercise: none  Diet management: sweets  Blood Glucose readings not checked at home   Dietician visit: Most recent: None for diabetes  Weight control: Maximum weight previously has been about 275   Wt Readings from Last 3 Encounters:  02/15/22 224 lb 3.2 oz (101.7 kg)  10/26/21 238 lb 6.4 oz (108.1 kg)  07/20/21 238 lb (108 kg)        He says his BMI recently based on his home scale is 34  Diabetes labs:  Lab Results  Component Value Date   HGBA1C 5.1 02/01/2022   HGBA1C 5.2 10/19/2021   HGBA1C 5.3 07/20/2021   Lab Results  Component Value Date   MICROALBUR 3.0 (H) 10/19/2021    CREATININE 0.91 02/01/2022    Lab Results  Component Value Date   FRUCTOSAMINE 226 10/19/2021     Allergies as of 02/15/2022   No Known Allergies      Medication List        Accurate as of February 15, 2022 11:57 AM. If you have any questions, ask your nurse or doctor.          ketoconazole 2 % shampoo Commonly known as: NIZORAL USE 3 TIMES PER WEEK ALTERNATING WITH OVER THE COUNTER DANDRUFF SHAMPOO   nystatin cream Commonly known as: MYCOSTATIN APPLY TO AFFECTED AREA TWICE A DAY ON FACE AS NEEDED**MIX EQUAL PARTS WITH TRIAMCINOLONE CREAM**   OneTouch Verio test strip Generic drug: glucose blood 1 each by Other route daily. And lancets 1/day   rosuvastatin 5 MG tablet Commonly known as: Crestor Take 1 tablet (5 mg total) by mouth daily.   tirzepatide 15 MG/0.5ML Pen Commonly known as: MOUNJARO Inject 15 mg into the skin once a week.        Allergies: No Known Allergies  Past Medical History:  Diagnosis Date   Elevated liver enzymes    Obesity, Class III, BMI 40-49.9 (morbid obesity) (HCC)    Seborrheic dermatitis of scalp    Tinea versicolor     Past Surgical History:  Procedure Laterality Date   TOENAIL EXCISION  2012   Ingrown    Family History  Problem Relation Age of Onset   High Cholesterol Mother    Diabetes Mother    Depression Father    Diabetes Father    High Cholesterol Father    Diabetes Maternal Grandmother    Diabetes Maternal Grandfather    Alcohol abuse Paternal Grandmother    Diabetes Paternal Grandmother    Diabetes Paternal Grandfather     Social History:  reports that he has never smoked. He has never used smokeless tobacco. He reports that he does not drink alcohol and does not use drugs.  Review of Systems:   Last urine microalbumin date: 8/23  Last foot exam date: 7/22  Symptoms of neuropathy:  Hypertension: Not present  BP Readings from Last 3 Encounters:  02/15/22 138/88  10/26/21 110/80  07/20/21  128/82    Lipid management: Has     Lab Results  Component Value Date   CHOL 141 02/01/2022   CHOL 157 10/19/2021   CHOL 174 06/11/2019   Lab Results  Component Value Date   HDL 34.00 (L) 02/01/2022   HDL 33.60 (L) 10/19/2021   HDL 31.90 (L) 06/11/2019   No results found for: "LDLCALC" Lab Results  Component Value Date   TRIG 280.0 (H) 02/01/2022   TRIG 211.0 (H) 10/19/2021   TRIG 235.0 (H) 06/11/2019   Lab Results  Component Value Date   CHOLHDL 4 02/01/2022   CHOLHDL 5 10/19/2021   CHOLHDL 5 06/11/2019   Lab Results  Component Value Date   LDLDIRECT 94.0 02/01/2022   LDLDIRECT 104.0 10/19/2021   LDLDIRECT 115.0 06/11/2019    He has had persistently abnormal liver functions and abnormal ultrasound in 8/21 indicating hepatic steatosis  Lab Results  Component Value Date   ALT 15 02/01/2022   ALT 21 10/19/2021   ALT 71 (H) 10/07/2019   ALT 96 (H) 07/29/2019   ALT 105 (H) 07/14/2019   ALT 75 (H) 06/11/2019      Examination:   BP 138/88   Pulse 90   Ht 5\' 9"  (1.753 m)   Wt 224 lb 3.2 oz (101.7 kg)   SpO2 95%   BMI 33.11 kg/m   Body mass index is 33.11 kg/m.    ASSESSMENT/ PLAN:   PREDIABETES with obesity  Current regimen: Mounjaro 10 mg weekly  See history of present illness for detailed discussion of current diabetes management, blood sugar patterns and problems identified  A1c is 5.2, highest baseline 6.3 Fructosamine also correlates with A1c and normal at 226 His weight loss appears to have not progressed with Mounjaro 10 mg and he thinks he can do better with portion control and cravings for sweets and carbohydrates Also not exercising Blood sugars appear to be quite normal with lab glucose nonfasting 85  Recommendations:  15 mg Mounjaro Need to start regular aerobic exercise Will benefit from nutritional counseling and will refer  Hepatic steatosis: Liver function abnormality resolved with weight loss and use of Mounjaro  Mixed  hyperlipidemia: Not controlled with weight loss Especially with LDL persistently over 100 he will benefit from statin therapy and will start with 5 mg of rosuvastatin Will do fasting lipids on the next visit  There are no Patient Instructions on file for this visit.   02/15/2022, 11:57 AM

## 2022-02-25 ENCOUNTER — Other Ambulatory Visit: Payer: Self-pay

## 2022-02-25 DIAGNOSIS — E782 Mixed hyperlipidemia: Secondary | ICD-10-CM

## 2022-02-25 DIAGNOSIS — R7303 Prediabetes: Secondary | ICD-10-CM

## 2022-02-25 DIAGNOSIS — K760 Fatty (change of) liver, not elsewhere classified: Secondary | ICD-10-CM

## 2022-02-25 MED ORDER — TIRZEPATIDE 15 MG/0.5ML ~~LOC~~ SOAJ: 15.0000 mg | SUBCUTANEOUS | 3 refills | Status: DC

## 2022-02-25 MED ORDER — ROSUVASTATIN CALCIUM 5 MG PO TABS: 5.0000 mg | ORAL_TABLET | Freq: Every day | ORAL | 3 refills | Status: AC

## 2022-02-25 NOTE — Telephone Encounter (Signed)
90 day Rx sent to pharmacy

## 2022-07-06 IMAGING — US US ABDOMEN COMPLETE
1 series · 14 of 25 positions shown · non-contrast
Comparison: None.

CLINICAL DATA: History of prediabetes.  Elevated LFTs.

EXAM:
ABDOMEN ULTRASOUND COMPLETE

[Series 2: us abdomen complete · 0.30mm/px · 14 of 91 slices shown]
[im 1/91]
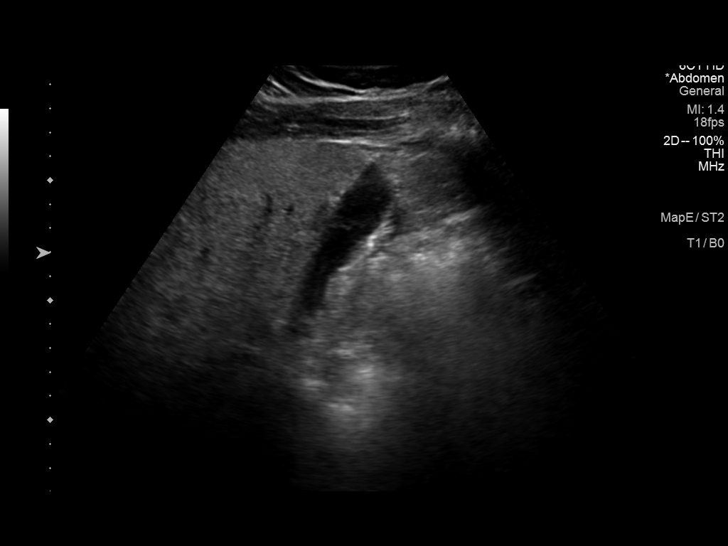
[im 8/91]
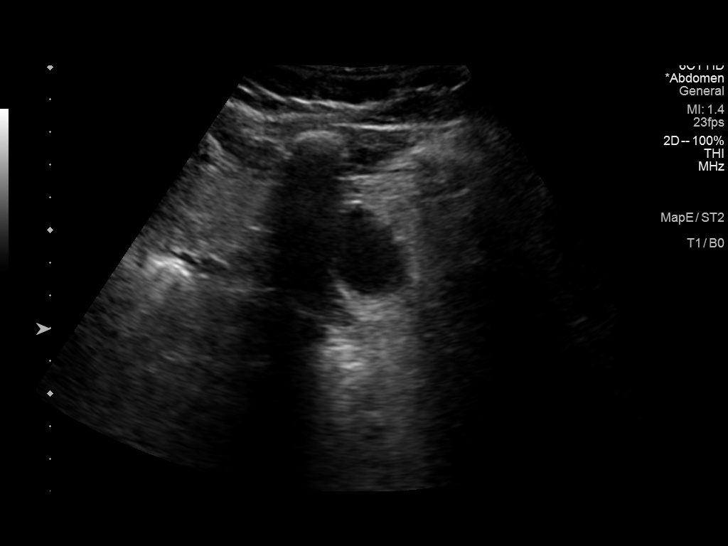
[im 16/91]
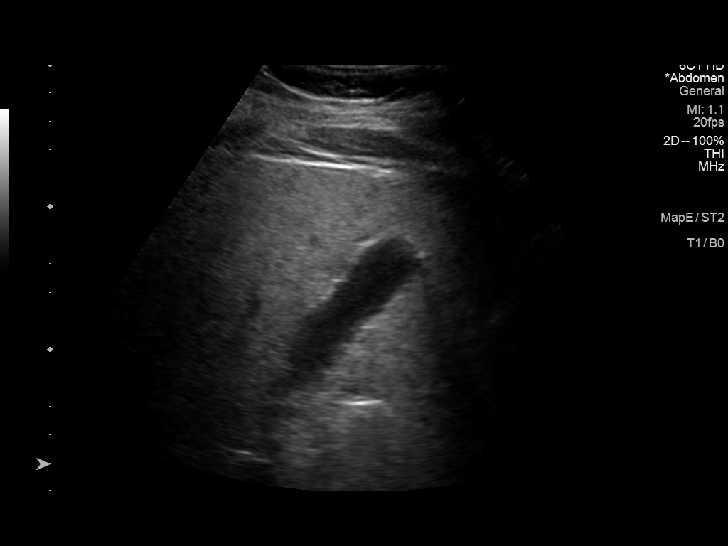
[im 23/91]
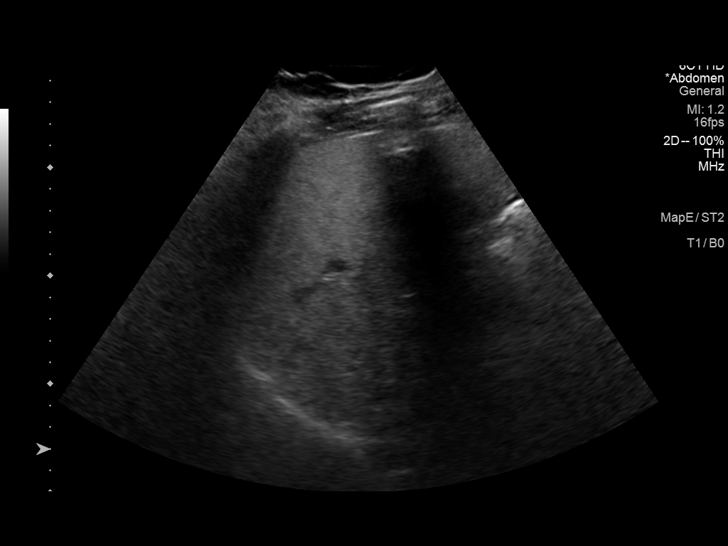
[im 31/91]
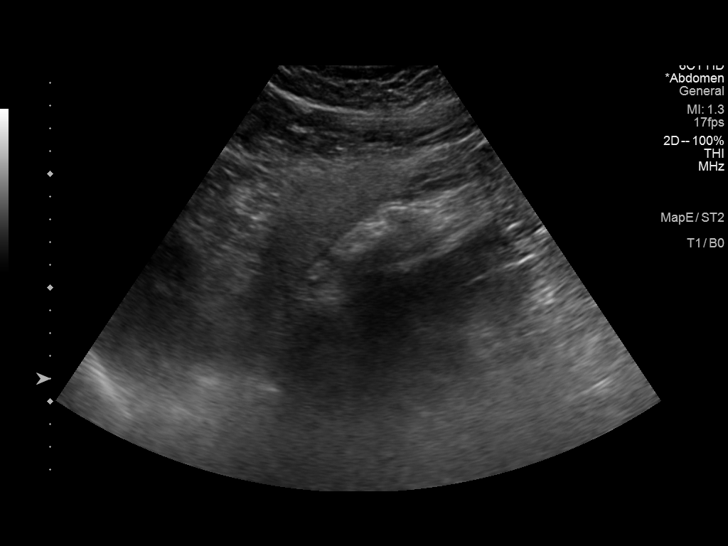
[im 34/91]
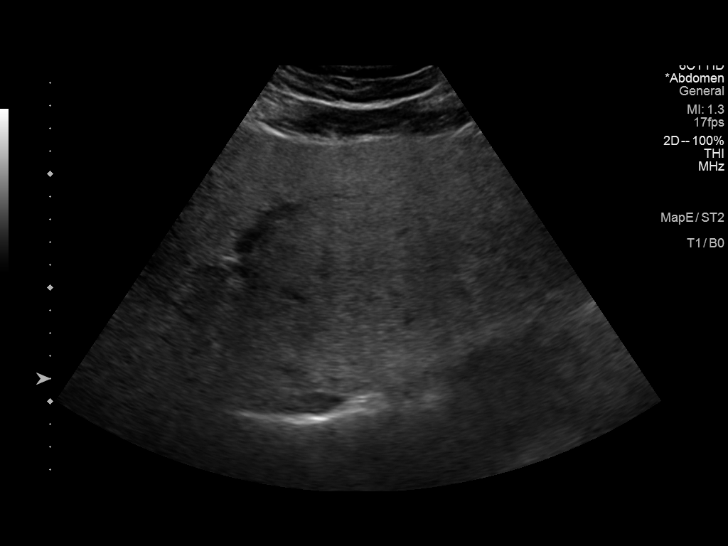
[im 42/91]
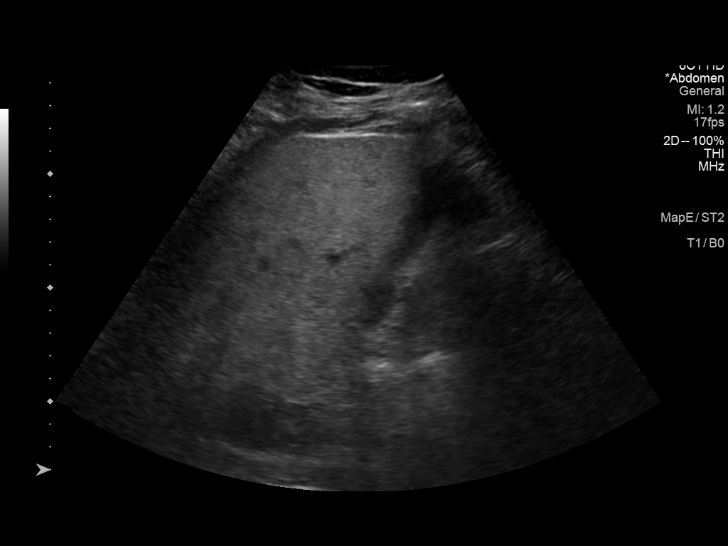
[im 49/91]
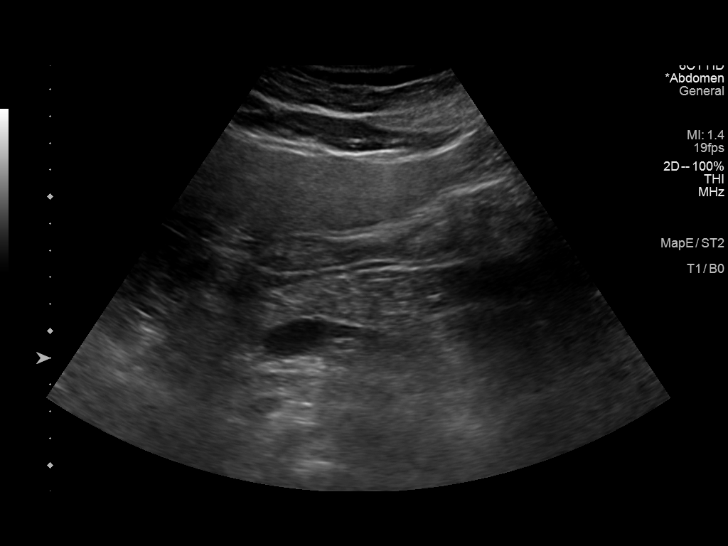
[im 57/91]
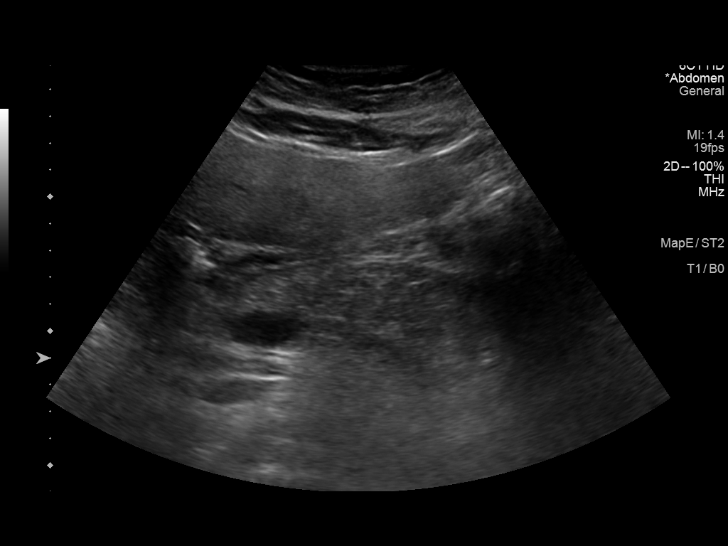
[im 61/91]
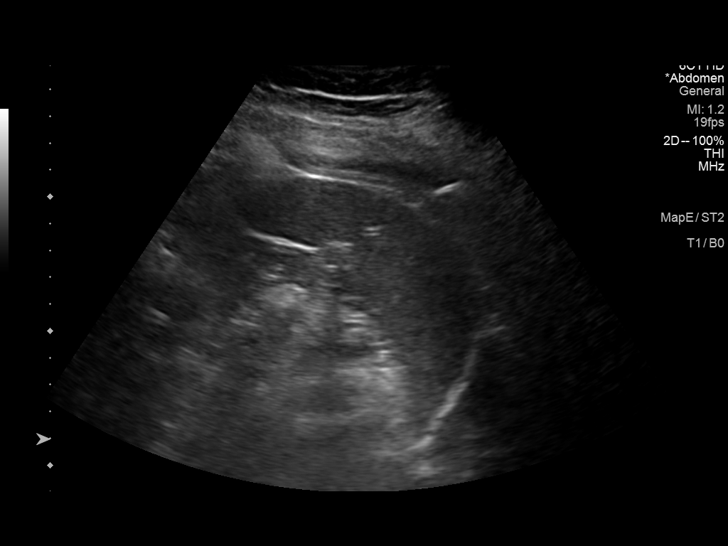
[im 68/91]
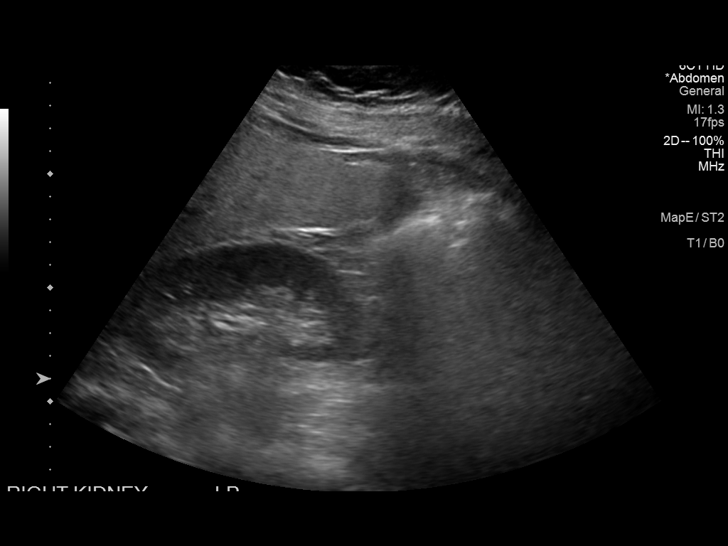
[im 76/91]
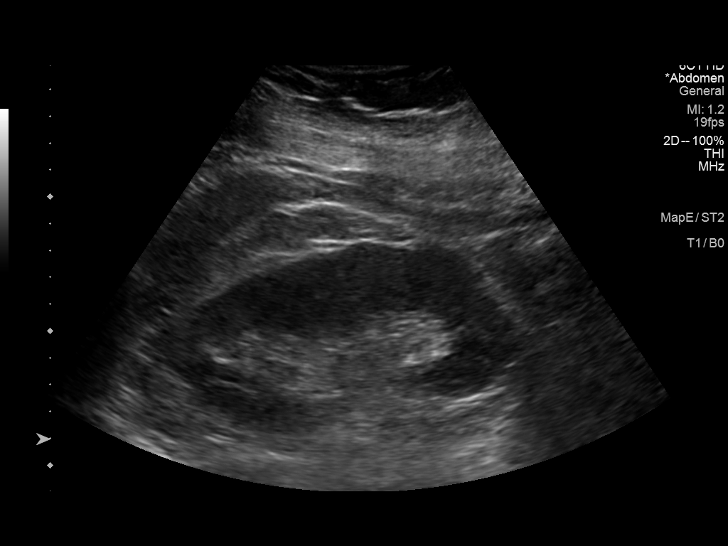
[im 83/91]
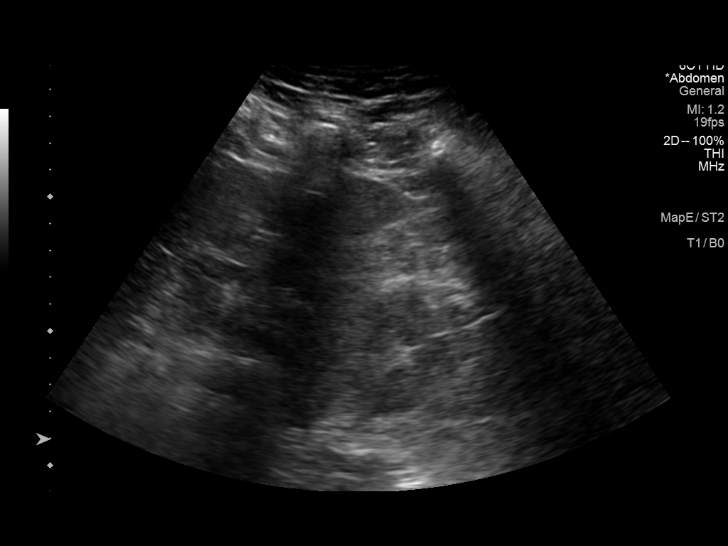
[im 91/91]
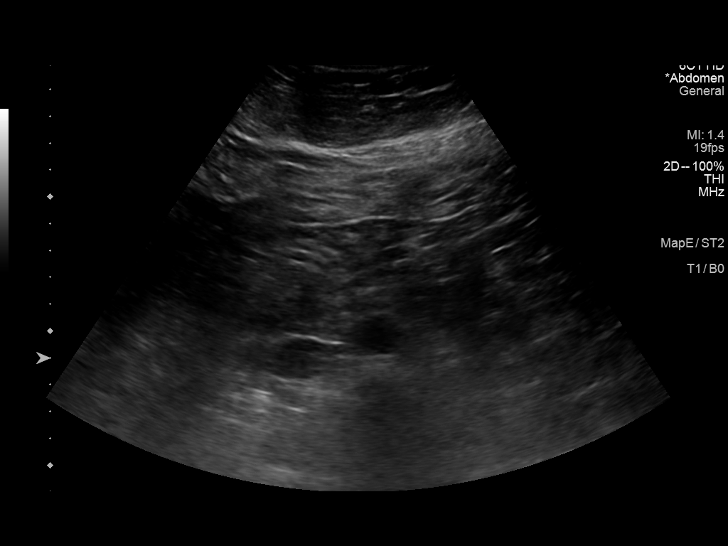

[14 of 25 positions shown; findings below may reference images not displayed]

FINDINGS: Gallbladder: No gallstones or wall thickening visualized. No
sonographic Murphy sign noted by sonographer.

Common bile duct: Diameter: 0.1 cm, within normal limits.

Liver: No focal lesion identified. The liver parenchymal
echogenicity is diffusely increased. Portal vein is patent on color
Doppler imaging with normal direction of blood flow towards the
liver.

IVC: No abnormality visualized.

Pancreas: Nonspecific mild heterogeneity of the pancreas.

Spleen: Size and appearance within normal limits.

Right Kidney: Length: 12.0 cm. Echogenicity within normal limits. No
mass or hydronephrosis visualized.

Left Kidney: Length: 13.3 cm. Echogenicity within normal limits. No
mass or hydronephrosis visualized.

Abdominal aorta: No aneurysm visualized.

Other findings: None.
IMPRESSION: 1. Diffusely increased echogenicity of the liver most commonly seen
in hepatic steatosis.

2. Mild heterogeneity of the pancreas which is nonspecific, may
indicate some degree of inflammation.

## 2022-08-16 ENCOUNTER — Other Ambulatory Visit: Payer: Self-pay

## 2022-08-16 ENCOUNTER — Other Ambulatory Visit (INDEPENDENT_AMBULATORY_CARE_PROVIDER_SITE_OTHER): Payer: 59

## 2022-08-16 DIAGNOSIS — E119 Type 2 diabetes mellitus without complications: Secondary | ICD-10-CM | POA: Diagnosis not present

## 2022-08-16 LAB — LIPID PANEL
Cholesterol: 164 mg/dL (ref 0–200)
HDL: 38.9 mg/dL — ABNORMAL LOW (ref >39.00)
LDL Cholesterol: 102 mg/dL — ABNORMAL HIGH (ref 0–99)
NonHDL: 124.95
Total CHOL/HDL Ratio: 4
Triglycerides: 116 mg/dL (ref 0.0–149.0)
VLDL: 23.2 mg/dL (ref 0.0–40.0)

## 2022-08-16 LAB — COMPREHENSIVE METABOLIC PANEL
ALT: 27 U/L (ref 0–53)
AST: 22 U/L (ref 0–37)
Albumin: 4.6 g/dL (ref 3.5–5.2)
Alkaline Phosphatase: 64 U/L (ref 39–117)
BUN: 11 mg/dL (ref 6–23)
CO2: 26 mEq/L (ref 19–32)
Calcium: 9.4 mg/dL (ref 8.4–10.5)
Chloride: 103 mEq/L (ref 96–112)
Creatinine, Ser: 0.96 mg/dL (ref 0.40–1.50)
GFR: 101.18 mL/min (ref >60.00)
Glucose, Bld: 94 mg/dL (ref 70–99)
Potassium: 3.6 mEq/L (ref 3.5–5.1)
Sodium: 137 mEq/L (ref 135–145)
Total Bilirubin: 0.7 mg/dL (ref 0.2–1.2)
Total Protein: 8.2 g/dL (ref 6.0–8.3)

## 2022-08-16 LAB — MICROALBUMIN / CREATININE URINE RATIO
Creatinine,U: 99.5 mg/dL
Microalb Creat Ratio: 1 mg/g (ref 0.0–30.0)
Microalb, Ur: 1 mg/dL (ref 0.0–1.9)

## 2022-08-16 LAB — HEMOGLOBIN A1C: Hgb A1c MFr Bld: 5 % (ref 4.6–6.5)

## 2022-08-23 ENCOUNTER — Ambulatory Visit: Payer: 59 | Admitting: "Endocrinology

## 2022-08-23 ENCOUNTER — Encounter: Payer: Self-pay | Admitting: "Endocrinology

## 2022-08-23 VITALS — BP 128/80 | HR 82 | Wt 220.9 lb

## 2022-08-23 DIAGNOSIS — E782 Mixed hyperlipidemia: Secondary | ICD-10-CM

## 2022-08-23 DIAGNOSIS — E6609 Other obesity due to excess calories: Secondary | ICD-10-CM | POA: Diagnosis not present

## 2022-08-23 DIAGNOSIS — Z6832 Body mass index (BMI) 32.0-32.9, adult: Secondary | ICD-10-CM | POA: Diagnosis not present

## 2022-08-23 DIAGNOSIS — R7303 Prediabetes: Secondary | ICD-10-CM

## 2022-08-23 MED ORDER — PHENTERMINE HCL 15 MG PO CAPS: 15.0000 mg | ORAL_CAPSULE | ORAL | 2 refills | Status: DC

## 2022-08-23 NOTE — Patient Instructions (Signed)
Diet: a) 3 meals per day schedule b: Restrict carbs to 60-70 grams (4 servings) per meal c) Colorful vegetables - 3 servings a day, and low sugar fruit 2 servings/day Plate control method: 1/4 plate protein, 1/4 starch, 1/2 green, yellow, or red vegetables d) Avoid carbohydrate snacks unless hypoglycemic episode, or increased physical activity  Regular exercise as tolerated, preferably 3 or more hours a week

## 2022-08-23 NOTE — Progress Notes (Signed)
Outpatient Endocrinology Note Randall Evergreen, MD  08/23/22   Randall Chase [birth year] [id number]  Referring Provider: Jeoffrey Massed, MD Primary Care Provider: Jeoffrey Massed, MD Reason for consultation: Subjective   Assessment & Plan  Diagnoses and all orders for this visit:  Prediabetes  Class 1 obesity due to excess calories with serious comorbidity and body mass index (BMI) of 32.0 to 32.9 in adult  Mixed hypercholesterolemia and hypertriglyceridemia  Other orders -     phentermine 15 MG capsule; Take 1 capsule (15 mg total) by mouth every morning.    Prediabetes, diagnosed in 2021 Hba1c goal less than 7.0, current Hba1c is 5. Will recommend the following: Mounjaro 15 mg weekly-lost 50 lbs with it, on plateau now Added phentermine 15 mg qd to help with weight loss No contraindications, discussed side effects   No known contraindications to any of above medications  Hyperlipidemia -Last LDL off goal: 102 -on rosuvastatin 5 mg QD: non compliant, discussed compliance  -Follow low fat diet and exercise   -Blood pressure goal <140/90 - Microalbumin/creatinine at goal < 30 - on ACE/ARB  -diet changes including salt restriction -limit eating outside -counseled BP targets per standards of diabetes care -Uncontrolled blood pressure can lead to retinopathy, nephropathy and cardiovascular and atherosclerotic heart disease  Reviewed and counseled on: -A1C target -Blood sugar targets -Complications of uncontrolled diabetes  -Checking blood sugar before meals and bedtime and bring log next visit -All medications with mechanism of action and side effects -Hypoglycemia management: rule of 15's, Glucagon Emergency Kit and medical alert ID -low-carb low-fat plate-method diet -At least 20 minutes of physical activity per day -Annual dilated retinal eye exam and foot exam -compliance and follow up needs -follow up as scheduled or earlier if problem gets  worse  Call if blood sugar is less than 70 or consistently above 250    Take a 15 gm snack of carbohydrate at bedtime before you go to sleep if your blood sugar is less than 100.    If you are going to fast after midnight for a test or procedure, ask your physician for instructions on how to reduce/decrease your insulin dose.    Call if blood sugar is less than 70 or consistently above 250  -Treating a low sugar by rule of 15  (15 gms of sugar every 15 min until sugar is more than 70) If you feel your sugar is low, test your sugar to be sure If your sugar is low (less than 70), then take 15 grams of a fast acting Carbohydrate (3-4 glucose tablets or glucose gel or 4 ounces of juice or regular soda) Recheck your sugar 15 min after treating low to make sure it is more than 70 If sugar is still less than 70, treat again with 15 grams of carbohydrate          Don't drive the hour of hypoglycemia  If unconscious/unable to eat or drink by mouth, use glucagon injection or nasal spray baqsimi and call 911. Can repeat again in 15 min if still unconscious.  Return in about 4 weeks (around 09/20/2022) for visit.   I have reviewed current medications, nurse's notes, allergies, vital signs, past medical and surgical history, family medical history, and social history for this encounter. Counseled patient on symptoms, examination findings, lab findings, imaging results, treatment decisions and monitoring and prognosis. The patient understood the recommendations and agrees with the treatment plan. All questions regarding treatment plan were fully answered.  Randall Rincon, MD  08/23/22    History of Present Illness Randall Chase is a 37 y.o. year old male who presents for follow up on Prediabetes, diagnosed in 2021.  Diabetes education +  Home diabetes regimen: Mounjaro 15 mg weekly  Previous history: His highest fasting glucose has been 136 as of 06/2019 and highest A1c 6.3 Non-insulin  hypoglycemic drugs previously used: Trulicity, Invokana, Victoza, metformin Did not benefit from metformin for weight loss  Side effects from medications: Trulicity caused nausea and diarrhea  COMPLICATIONS -  MI/Stroke -  retinopathy -  neuropathy -  nephropathy  BLOOD SUGAR DATA Doesn't check BG at home  Physical Exam  BP 128/80   Pulse 82   Wt 220 lb 14.4 oz (100.2 kg)   SpO2 96%   BMI 32.62 kg/m    Constitutional: well developed, well nourished Head: normocephalic, atraumatic Eyes: sclera anicteric, no redness Neck: supple Lungs: normal respiratory effort Neurology: alert and oriented Skin: dry, no appreciable rashes Musculoskeletal: no appreciable defects Psychiatric: normal mood and affect   Current Medications Patient's Medications  New Prescriptions   PHENTERMINE 15 MG CAPSULE    Take 1 capsule (15 mg total) by mouth every morning.  Previous Medications   GLUCOSE BLOOD (ONETOUCH VERIO) TEST STRIP    1 each by Other route daily. And lancets 1/day   KETOCONAZOLE (NIZORAL) 2 % SHAMPOO    USE 3 TIMES PER WEEK ALTERNATING WITH OVER THE COUNTER DANDRUFF SHAMPOO   NYSTATIN CREAM (MYCOSTATIN)    APPLY TO AFFECTED AREA TWICE A DAY ON FACE AS NEEDED**MIX EQUAL PARTS WITH TRIAMCINOLONE CREAM**   ROSUVASTATIN (CRESTOR) 5 MG TABLET    Take 1 tablet (5 mg total) by mouth daily.   TIRZEPATIDE (MOUNJARO) 15 MG/0.5ML PEN    Inject 15 mg into the skin once a week.  Modified Medications   No medications on file  Discontinued Medications   No medications on file    Allergies No Known Allergies  Past Medical History Past Medical History:  Diagnosis Date   Elevated liver enzymes    Obesity, Class III, BMI 40-49.9 (morbid obesity) (HCC)    Seborrheic dermatitis of scalp    Tinea versicolor     Past Surgical History Past Surgical History:  Procedure Laterality Date   TOENAIL EXCISION  2012   Ingrown    Family History family history includes Alcohol abuse in his  paternal grandmother; Depression in his father; Diabetes in his father, maternal grandfather, maternal grandmother, mother, paternal grandfather, and paternal grandmother; High Cholesterol in his father and mother.  Social History Social History   Socioeconomic History   Marital status: Married    Spouse name: Not on file   Number of children: Not on file   Years of education: Not on file   Highest education level: Not on file  Occupational History   Occupation: Doctor, general practice, EMT  Tobacco Use   Smoking status: Never   Smokeless tobacco: Never  Substance and Sexual Activity   Alcohol use: Never   Drug use: Never   Sexual activity: Yes    Partners: Female    Birth control/protection: None  Other Topics Concern   Not on file  Social History Narrative   Longtime girlfriend, 2 y/o daughter.   Relocated to [place] from [place], [place] [address]  Orig from [place].   College: Ass[employer] degree in [employer].   Occup: Data entry, asset recovery.   No T/A/Ds   Social Determinants of Health   Financial  Resource Strain: Not on file  Food Insecurity: Not on file  Transportation Needs: Not on file  Physical Activity: Not on file  Stress: Not on file  Social Connections: Not on file  Intimate Partner Violence: Not on file    Lab Results  Component Value Date   HGBA1C 5.0 08/16/2022   HGBA1C 5.1 02/01/2022   HGBA1C 5.2 10/19/2021   Lab Results  Component Value Date   CHOL 164 08/16/2022   Lab Results  Component Value Date   HDL 38.90 (L) 08/16/2022   Lab Results  Component Value Date   LDLCALC 102 (H) 08/16/2022   Lab Results  Component Value Date   TRIG 116.0 08/16/2022   Lab Results  Component Value Date   CHOLHDL 4 08/16/2022   Lab Results  Component Value Date   CREATININE 0.96 08/16/2022   Lab Results  Component Value Date   GFR 101.18 08/16/2022   Lab Results  Component Value Date   MICROALBUR 1.0 08/16/2022      Component Value Date/Time   NA 137 08/16/2022 1149    NA 138 10/07/2019 1533   K 3.6 08/16/2022 1149   CL 103 08/16/2022 1149   CO2 26 08/16/2022 1149   GLUCOSE 94 08/16/2022 1149   BUN 11 08/16/2022 1149   BUN 9 10/07/2019 1533   CREATININE 0.96 08/16/2022 1149   CREATININE 0.90 07/14/2019 0759   CALCIUM 9.4 08/16/2022 1149   PROT 8.2 08/16/2022 1149   PROT 7.9 10/07/2019 1533   ALBUMIN 4.6 08/16/2022 1149   ALBUMIN 4.3 10/07/2019 1533   AST 22 08/16/2022 1149   AST 61 (H) 07/14/2019 0759   ALT 27 08/16/2022 1149   ALT 105 (H) 07/14/2019 0759   ALKPHOS 64 08/16/2022 1149   BILITOT 0.7 08/16/2022 1149   BILITOT 0.8 10/07/2019 1533   BILITOT 0.4 07/14/2019 0759   GFRNONAA 115 10/07/2019 1533   GFRNONAA >60 07/14/2019 0759   GFRAA 133 10/07/2019 1533   GFRAA >60 07/14/2019 0759      Latest Ref Rng & Units 08/16/2022   11:49 AM 02/01/2022   11:43 AM 10/19/2021    1:34 PM  BMP  Glucose 70 - 99 mg/dL 94  161  85   BUN 6 - 23 mg/dL 11  11  10    Creatinine 0.40 - 1.50 mg/dL 0.96  0.45  4.09   Sodium 135 - 145 mEq/L 137  138  138   Potassium 3.5 - 5.1 mEq/L 3.6  3.6  3.9   Chloride 96 - 112 mEq/L 103  104  104   CO2 19 - 32 mEq/L 26  28  27    Calcium 8.4 - 10.5 mg/dL 9.4  9.4  9.4        Component Value Date/Time   WBC 7.3 07/14/2019 0759   WBC 7.9 06/11/2019 0836   RBC 4.85 07/14/2019 0759   RBC 4.83 07/14/2019 0759   HGB 15.0 07/14/2019 0759   HCT 42.4 07/14/2019 0759   PLT 327 07/14/2019 0759   MCV 87.8 07/14/2019 0759   MCH 31.1 07/14/2019 0759   MCHC 35.4 07/14/2019 0759   RDW 12.1 07/14/2019 0759   LYMPHSABS 2.7 07/14/2019 0759   MONOABS 0.6 07/14/2019 0759   EOSABS 0.4 07/14/2019 0759   BASOSABS 0.1 07/14/2019 0759     Parts of this note may have been dictated using voice recognition software. There may be variances in spelling and vocabulary which are unintentional. Not all errors are proofread. Please  notify the Thereasa Parkin if any discrepancies are noted or if the meaning of any statement is not clear.

## 2022-09-17 IMAGING — CR DG CHEST 1V
1 series · 1 of 1 positions shown · non-contrast
Comparison: None.

CLINICAL DATA: Pre-employment examination

EXAM:
CHEST  1 VIEW

[w chest pa]
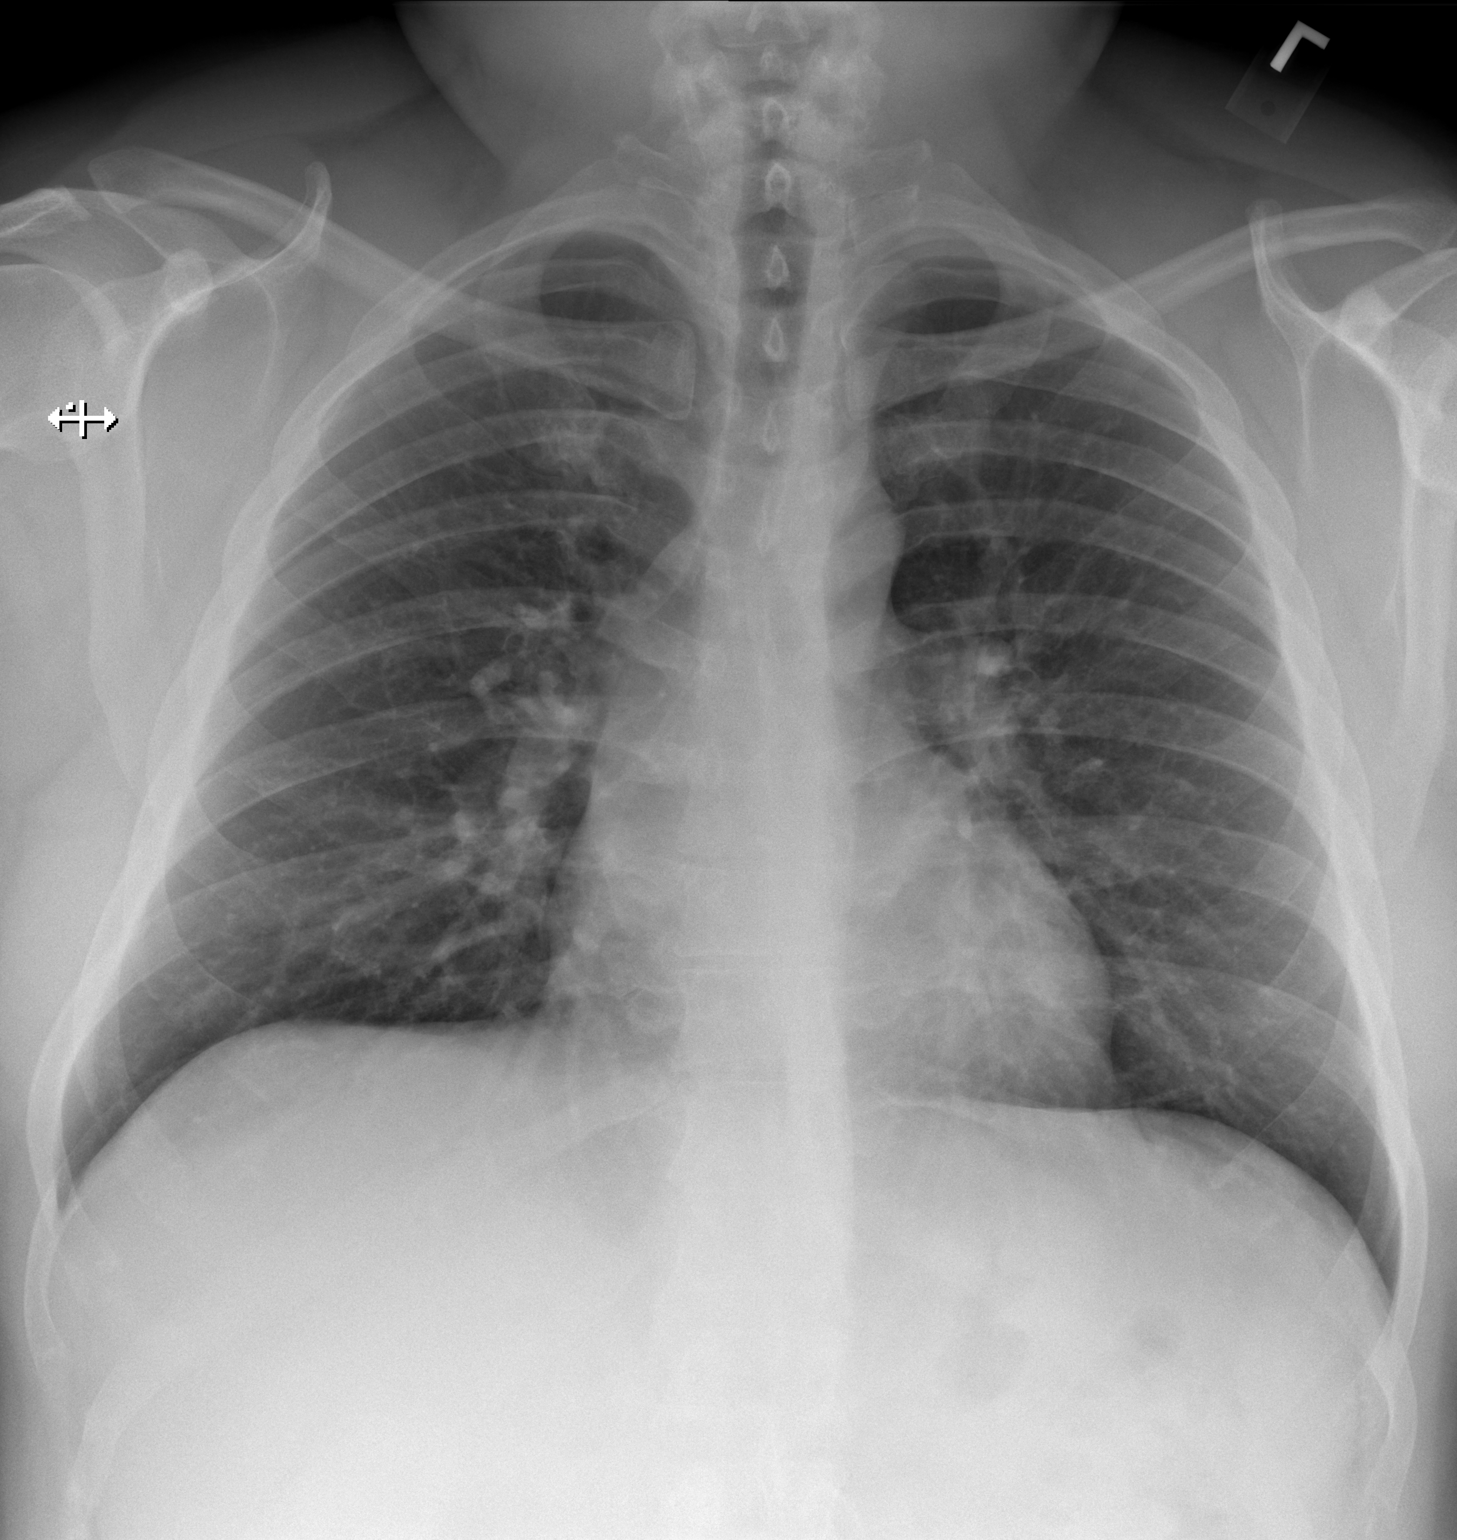

[1 of 1 positions shown; findings below may reference images not displayed]

FINDINGS: No consolidation, features of edema, pneumothorax, or effusion.
Pulmonary vascularity is normally distributed. The cardiomediastinal
contours are unremarkable. No acute osseous or soft tissue
abnormality.
IMPRESSION: No acute cardiopulmonary abnormality.

## 2022-09-20 ENCOUNTER — Ambulatory Visit: Payer: 59 | Admitting: "Endocrinology

## 2022-09-20 ENCOUNTER — Encounter: Payer: Self-pay | Admitting: "Endocrinology

## 2022-09-20 VITALS — BP 125/80 | HR 102 | Ht 69.0 in | Wt 258.0 lb

## 2022-09-20 DIAGNOSIS — E782 Mixed hyperlipidemia: Secondary | ICD-10-CM | POA: Diagnosis not present

## 2022-09-20 DIAGNOSIS — R7303 Prediabetes: Secondary | ICD-10-CM | POA: Diagnosis not present

## 2022-09-20 DIAGNOSIS — E6609 Other obesity due to excess calories: Secondary | ICD-10-CM | POA: Diagnosis not present

## 2022-09-20 DIAGNOSIS — Z6832 Body mass index (BMI) 32.0-32.9, adult: Secondary | ICD-10-CM

## 2022-09-20 MED ORDER — PHENTERMINE HCL 30 MG PO CAPS: 30.0000 mg | ORAL_CAPSULE | ORAL | 0 refills | Status: DC

## 2022-09-20 NOTE — Progress Notes (Signed)
Outpatient Endocrinology Note Randall Revere, MD  09/20/22   Rand[place] [birth year] [id number]  Referring Provider: Jeoffrey Massed, MD Primary Care Provider: Jeoffrey Massed, MD Reason for consultation: Subjective   Assessment & Plan  Thelbert was seen today for diabetes.  Diagnoses and all orders for this visit:  Prediabetes  Class 1 obesity due to excess calories with serious comorbidity and body mass index (BMI) of 32.0 to 32.9 in adult  Mixed hypercholesterolemia and hypertriglyceridemia  Other orders -     phentermine 30 MG capsule; Take 1 capsule (30 mg total) by mouth every morning.    Prediabetes, diagnosed in 2021 Hba1c goal less than 7.0, current Hba1c is 5. Will recommend the following: Mounjaro 15 mg weekly-lost 50 lbs with it, on plateau now Start phentermine 30 mg qd to help with weight loss on 09/19/2021 (current weight 258 lbs today with work vest on). No contraindications, discussed side effects   No known contraindications to any of above medications  Hyperlipidemia -Last LDL off goal: 102 -on rosuvastatin 5 mg QD: non compliant, discussed compliance previously  -Follow low fat diet and exercise   -Blood pressure goal <140/90 - Microalbumin/creatinine at goal < 30 - not on ACE/ARB  -diet changes including salt restriction -limit eating outside -counseled BP targets per standards of diabetes care -Uncontrolled blood pressure can lead to retinopathy, nephropathy and cardiovascular and atherosclerotic heart disease  Reviewed and counseled on: -A1C target -Blood sugar targets -Complications of uncontrolled diabetes  -Checking blood sugar before meals and bedtime and bring log next visit -All medications with mechanism of action and side effects -Hypoglycemia management: rule of 15's, Glucagon Emergency Kit and medical alert ID -low-carb low-fat plate-method diet -At least 20 minutes of physical activity per day -Annual dilated  retinal eye exam and foot exam -compliance and follow up needs -follow up as scheduled or earlier if problem gets worse  Call if blood sugar is less than 70 or consistently above 250    Take a 15 gm snack of carbohydrate at bedtime before you go to sleep if your blood sugar is less than 100.    If you are going to fast after midnight for a test or procedure, ask your physician for instructions on how to reduce/decrease your insulin dose.    Call if blood sugar is less than 70 or consistently above 250  -Treating a low sugar by rule of 15  (15 gms of sugar every 15 min until sugar is more than 70) If you feel your sugar is low, test your sugar to be sure If your sugar is low (less than 70), then take 15 grams of a fast acting Carbohydrate (3-4 glucose tablets or glucose gel or 4 ounces of juice or regular soda) Recheck your sugar 15 min after treating low to make sure it is more than 70 If sugar is still less than 70, treat again with 15 grams of carbohydrate          Don't drive the hour of hypoglycemia  If unconscious/unable to eat or drink by mouth, use glucagon injection or nasal spray baqsimi and call 911. Can repeat again in 15 min if still unconscious.  Return in about 3 months (around 12/25/2022).   I have reviewed current medications, nurse's notes, allergies, vital signs, past medical and surgical history, family medical history, and social history for this encounter. Counseled patient on symptoms, examination findings, lab findings, imaging results, treatment decisions and monitoring and prognosis. The  patient understood the recommendations and agrees with the treatment plan. All questions regarding treatment plan were fully answered.  Randall Kraemer, MD  09/20/22    History of Present Illness Randall Chase is a 37 y.o. year old male who presents for follow up on Prediabetes, diagnosed in 2021.  Diabetes education +  Home diabetes regimen: Mounjaro 15 mg  weekly Phenteramine 15 mg every day for 3 weeks without weight benefit/side effects   Previous history: His highest fasting glucose has been 136 as of 06/2019 and highest A1c 6.3 Non-insulin hypoglycemic drugs previously used: Trulicity, Invokana, Victoza, metformin Did not benefit from metformin for weight loss  Side effects from medications: Trulicity caused nausea and diarrhea  COMPLICATIONS -  MI/Stroke -  retinopathy -  neuropathy -  nephropathy  BLOOD SUGAR DATA Doesn't check BG at home  Physical Exam  BP 125/80   Pulse (!) 102   Ht 5\' 9"  (1.753 m)   Wt 258 lb (117 kg)   SpO2 96%   BMI 38.10 kg/m    Constitutional: well developed, well nourished Head: normocephalic, atraumatic Eyes: sclera anicteric, no redness Neck: supple Lungs: normal respiratory effort Neurology: alert and oriented Skin: dry, no appreciable rashes Musculoskeletal: no appreciable defects Psychiatric: normal mood and affect   Current Medications Patient's Medications  New Prescriptions   No medications on file  Previous Medications   GLUCOSE BLOOD (ONETOUCH VERIO) TEST STRIP    1 each by Other route daily. And lancets 1/day   KETOCONAZOLE (NIZORAL) 2 % SHAMPOO    USE 3 TIMES PER WEEK ALTERNATING WITH OVER THE COUNTER DANDRUFF SHAMPOO   NYSTATIN CREAM (MYCOSTATIN)    APPLY TO AFFECTED AREA TWICE A DAY ON FACE AS NEEDED**MIX EQUAL PARTS WITH TRIAMCINOLONE CREAM**   ROSUVASTATIN (CRESTOR) 5 MG TABLET    Take 1 tablet (5 mg total) by mouth daily.   TIRZEPATIDE (MOUNJARO) 15 MG/0.5ML PEN    Inject 15 mg into the skin once a week.  Modified Medications   Modified Medication Previous Medication   PHENTERMINE 30 MG CAPSULE phentermine 15 MG capsule      Take 1 capsule (30 mg total) by mouth every morning.    Take 1 capsule (15 mg total) by mouth every morning.  Discontinued Medications   No medications on file    Allergies No Known Allergies  Past Medical History Past Medical History:   Diagnosis Date   Elevated liver enzymes    Obesity, Class III, BMI 40-49.9 (morbid obesity) (HCC)    Seborrheic dermatitis of scalp    Tinea versicolor     Past Surgical History Past Surgical History:  Procedure Laterality Date   TOENAIL EXCISION  2012   Ingrown    Family History family history includes Alcohol abuse in his paternal grandmother; Depression in his father; Diabetes in his father, maternal grandfather, maternal grandmother, mother, paternal grandfather, and paternal grandmother; High Cholesterol in his father and mother.  Social History Social History   Socioeconomic History   Marital status: Married    Spouse name: Not on file   Number of children: Not on file   Years of education: Not on file   Highest education level: Not on file  Occupational History   Occupation: Doctor, general practice, EMT  Tobacco Use   Smoking status: Never   Smokeless tobacco: Never  Substance and Sexual Activity   Alcohol use: Never   Drug use: Never   Sexual activity: Yes    Partners: Female    Birth  control/protection: None  Other Topics Concern   Not on file  Social History Narrative   Longtime girlfriend, 2 y/o daughter.   Relocated to [place] from [place], [place] [address].  Orig from [place].   College: Assoc degree in [place] [employer].   Occup: Data entry, asset recovery.   No T/A/Ds   Social Determinants of Health   Financial Resource Strain: Not on file  Food Insecurity: Not on file  Transportation Needs: Not on file  Physical Activity: Not on file  Stress: Not on file  Social Connections: Not on file  Intimate Partner Violence: Not on file    Lab Results  Component Value Date   HGBA1C 5.0 08/16/2022   HGBA1C 5.1 02/01/2022   HGBA1C 5.2 10/19/2021   Lab Results  Component Value Date   CHOL 164 08/16/2022   Lab Results  Component Value Date   HDL 38.90 (L) 08/16/2022   Lab Results  Component Value Date   LDLCALC 102 (H) 08/16/2022   Lab Results  Component Value Date   TRIG  116.0 08/16/2022   Lab Results  Component Value Date   CHOLHDL 4 08/16/2022   Lab Results  Component Value Date   CREATININE 0.96 08/16/2022   Lab Results  Component Value Date   GFR 101.18 08/16/2022   Lab Results  Component Value Date   MICROALBUR 1.0 08/16/2022      Component Value Date/Time   NA 137 08/16/2022 1149   NA 138 10/07/2019 1533   K 3.6 08/16/2022 1149   CL 103 08/16/2022 1149   CO2 26 08/16/2022 1149   GLUCOSE 94 08/16/2022 1149   BUN 11 08/16/2022 1149   BUN 9 10/07/2019 1533   CREATININE 0.96 08/16/2022 1149   CREATININE 0.90 07/14/2019 0759   CALCIUM 9.4 08/16/2022 1149   PROT 8.2 08/16/2022 1149   PROT 7.9 10/07/2019 1533   ALBUMIN 4.6 08/16/2022 1149   ALBUMIN 4.3 10/07/2019 1533   AST 22 08/16/2022 1149   AST 61 (H) 07/14/2019 0759   ALT 27 08/16/2022 1149   ALT 105 (H) 07/14/2019 0759   ALKPHOS 64 08/16/2022 1149   BILITOT 0.7 08/16/2022 1149   BILITOT 0.8 10/07/2019 1533   BILITOT 0.4 07/14/2019 0759   GFRNONAA 115 10/07/2019 1533   GFRNONAA >60 07/14/2019 0759   GFRAA 133 10/07/2019 1533   GFRAA >60 07/14/2019 0759      Latest Ref Rng & Units 08/16/2022   11:49 AM 02/01/2022   11:43 AM 10/19/2021    1:34 PM  BMP  Glucose 70 - 99 mg/dL 94  782  85   BUN 6 - 23 mg/dL 11  11  10    Creatinine 0.40 - 1.50 mg/dL 9.56  2.13  0.86   Sodium 135 - 145 mEq/L 137  138  138   Potassium 3.5 - 5.1 mEq/L 3.6  3.6  3.9   Chloride 96 - 112 mEq/L 103  104  104   CO2 19 - 32 mEq/L 26  28  27    Calcium 8.4 - 10.5 mg/dL 9.4  9.4  9.4        Component Value Date/Time   WBC 7.3 07/14/2019 0759   WBC 7.9 06/11/2019 0836   RBC 4.85 07/14/2019 0759   RBC 4.83 07/14/2019 0759   HGB 15.0 07/14/2019 0759   HCT 42.4 07/14/2019 0759   PLT 327 07/14/2019 0759   MCV 87.8 07/14/2019 0759   MCH 31.1 07/14/2019 0759   MCHC 35.4 07/14/2019 0759  RDW 12.1 07/14/2019 0759   LYMPHSABS 2.7 07/14/2019 0759   MONOABS 0.6 07/14/2019 0759   EOSABS 0.4 07/14/2019  0759   BASOSABS 0.1 07/14/2019 0759     Parts of this note may have been dictated using voice recognition software. There may be variances in spelling and vocabulary which are unintentional. Not all errors are proofread. Please notify the [facility] Parkin if any discrepancies are noted or if the meaning of any statement is not clear.

## 2022-12-25 ENCOUNTER — Other Ambulatory Visit: Payer: Self-pay

## 2022-12-25 ENCOUNTER — Encounter: Payer: Self-pay | Admitting: "Endocrinology

## 2022-12-25 ENCOUNTER — Telehealth: Payer: 59 | Admitting: "Endocrinology

## 2022-12-25 DIAGNOSIS — E6609 Other obesity due to excess calories: Secondary | ICD-10-CM

## 2022-12-25 DIAGNOSIS — E66811 Obesity, class 1: Secondary | ICD-10-CM

## 2022-12-25 DIAGNOSIS — R7303 Prediabetes: Secondary | ICD-10-CM | POA: Diagnosis not present

## 2022-12-25 DIAGNOSIS — E782 Mixed hyperlipidemia: Secondary | ICD-10-CM

## 2022-12-25 DIAGNOSIS — Z6832 Body mass index (BMI) 32.0-32.9, adult: Secondary | ICD-10-CM

## 2022-12-25 MED ORDER — QSYMIA 3.75-23 MG PO CP24: 1.0000 | ORAL_CAPSULE | Freq: Every day | ORAL | 4 refills | Status: DC

## 2022-12-25 MED ORDER — TIRZEPATIDE 15 MG/0.5ML ~~LOC~~ SOAJ
15.0000 mg | SUBCUTANEOUS | 3 refills | Status: DC
Start: 2022-12-25 — End: 2023-01-30
  Filled 2022-12-25 – 2023-01-08 (×2): qty 2, 28d supply, fill #0

## 2022-12-25 NOTE — Progress Notes (Signed)
The patient reports they are currently: [place]. I spent 10-11 minutes on the video with the patient on the date of service. I spent an additional 15 minutes on pre- and post-visit activities on the date of service.   The patient was physically located in [place] or a state in which I am permitted to provide care. The patient and/or parent/guardian understood that s/he may incur co-pays and cost sharing, and agreed to the telemedicine visit. The visit was reasonable and appropriate under the circumstances given the patient's presentation at the time.  The patient and/or parent/guardian has been advised of the potential risks and limitations of this mode of treatment (including, but not limited to, the absence of in-person examination) and has agreed to be treated using telemedicine. The patient's/patient's family's questions regarding telemedicine have been answered.   The patient and/or parent/guardian has also been advised to contact their provider's office for worsening conditions, and seek emergency medical treatment and/or call 911 if the patient deems either necessary.    Outpatient Endocrinology Note Randall Barnum, MD  12/26/22   Randall Chase 11/22/1985 [id number]  Referring Provider: Jeoffrey Massed, MD Primary Care Provider: Jeoffrey Massed, MD Reason for consultation: Subjective   Assessment & Plan  Diagnoses and all orders for this visit:  Class 1 obesity due to excess calories with serious comorbidity and body mass index (BMI) of 32.0 to 32.9 in adult  Prediabetes -     tirzepatide (MOUNJARO) 15 MG/0.5ML Pen; Inject 15 mg into the skin once a week.  Mixed hypercholesterolemia and hypertriglyceridemia  Other orders -     Phentermine-Topiramate (QSYMIA) 3.75-23 MG CP24; Take 1 capsule by mouth daily in the afternoon.    Prediabetes, diagnosed in 2021 Hba1c goal less than 7.0, current Hba1c is 5. Will recommend the following: Mounjaro 15 mg weekly-lost  50 lbs with it, on plateau now Started phentermine 30 mg qd to help with weight loss on 09/19/2021 (current weight 258 lbs today with work vest on). Stopped after 1 mo after experienced no weight loss  Start qsymia 3.75/23mg  every day on 12/26/22 and follow up in 6 weeks  No contraindications, discussed side effects   No known contraindications to any of above medications  Hyperlipidemia -Last LDL off goal: 102 -on rosuvastatin 5 mg QD: non compliant, discussed compliance previously  -Follow low fat diet and exercise   -Blood pressure goal <140/90 - Microalbumin/creatinine at goal < 30 - not on ACE/ARB  -diet changes including salt restriction -limit eating outside -counseled BP targets per standards of diabetes care -Uncontrolled blood pressure can lead to retinopathy, nephropathy and cardiovascular and atherosclerotic heart disease  Reviewed and counseled on: -A1C target -Blood sugar targets -Complications of uncontrolled diabetes  -Checking blood sugar before meals and bedtime and bring log next visit -All medications with mechanism of action and side effects -Hypoglycemia management: rule of 15's, Glucagon Emergency Kit and medical alert ID -low-carb low-fat plate-method diet -At least 20 minutes of physical activity per day -Annual dilated retinal eye exam and foot exam -compliance and follow up needs -follow up as scheduled or earlier if problem gets worse  Call if blood sugar is less than 70 or consistently above 250    Take a 15 gm snack of carbohydrate at bedtime before you go to sleep if your blood sugar is less than 100.    If you are going to fast after midnight for a test or procedure, ask your physician for instructions on how to reduce/decrease  your insulin dose.    Call if blood sugar is less than 70 or consistently above 250  -Treating a low sugar by rule of 15  (15 gms of sugar every 15 min until sugar is more than 70) If you feel your sugar is low, test your  sugar to be sure If your sugar is low (less than 70), then take 15 grams of a fast acting Carbohydrate (3-4 glucose tablets or glucose gel or 4 ounces of juice or regular soda) Recheck your sugar 15 min after treating low to make sure it is more than 70 If sugar is still less than 70, treat again with 15 grams of carbohydrate          Don't drive the hour of hypoglycemia  If unconscious/unable to eat or drink by mouth, use glucagon injection or nasal spray baqsimi and call 911. Can repeat again in 15 min if still unconscious.  Return in about 3 months (around 03/27/2023) for tele-visit: 3:20 pm.   I have reviewed current medications, nurse's notes, allergies, vital signs, past medical and surgical history, family medical history, and social history for this encounter. Counseled patient on symptoms, examination findings, lab findings, imaging results, treatment decisions and monitoring and prognosis. The patient understood the recommendations and agrees with the treatment plan. All questions regarding treatment plan were fully answered.  Randall Bantry, MD  12/26/22    History of Present Illness Randall Chase is a 37 y.o. year old male who presents for follow up on Prediabetes, diagnosed in 2021.  Diabetes education +  Home diabetes regimen: Mounjaro 15 mg weekly Phenteramine 15 mg every day for 3 weeks without weight benefit/side effects  Tried phentermine 30 mg every day for 30 days without effect, self discontinued   Previous history: His highest fasting glucose has been 136 as of 06/2019 and highest A1c 6.3 Non-insulin hypoglycemic drugs previously used: Trulicity, Invokana, Victoza, metformin Did not benefit from metformin for weight loss  Side effects from medications: Trulicity caused nausea and diarrhea  COMPLICATIONS -  MI/Stroke -  retinopathy -  neuropathy -  nephropathy  BLOOD SUGAR DATA Doesn't check BG at home  Physical Exam  There were no vitals taken for  this visit.   Constitutional: well developed, well nourished Head: normocephalic, atraumatic Eyes: sclera anicteric, no redness Neck: supple Lungs: normal respiratory effort Neurology: alert and oriented Skin: dry, no appreciable rashes Musculoskeletal: no appreciable defects Psychiatric: normal mood and affect   Current Medications Patient's Medications  New Prescriptions   PHENTERMINE-TOPIRAMATE (QSYMIA) 3.75-23 MG CP24    Take 1 capsule by mouth daily in the afternoon.  Previous Medications   GLUCOSE BLOOD (ONETOUCH VERIO) TEST STRIP    1 each by Other route daily. And lancets 1/day   KETOCONAZOLE (NIZORAL) 2 % SHAMPOO    USE 3 TIMES PER WEEK ALTERNATING WITH OVER THE COUNTER DANDRUFF SHAMPOO   NYSTATIN CREAM (MYCOSTATIN)    APPLY TO AFFECTED AREA TWICE A DAY ON FACE AS NEEDED**MIX EQUAL PARTS WITH TRIAMCINOLONE CREAM**   ROSUVASTATIN (CRESTOR) 5 MG TABLET    Take 1 tablet (5 mg total) by mouth daily.  Modified Medications   Modified Medication Previous Medication   TIRZEPATIDE (MOUNJARO) 15 MG/0.5ML PEN tirzepatide (MOUNJARO) 15 MG/0.5ML Pen      Inject 15 mg into the skin once a week.    Inject 15 mg into the skin once a week.  Discontinued Medications   PHENTERMINE 30 MG CAPSULE    Take 1 capsule (  30 mg total) by mouth every morning.    Allergies No Known Allergies  Past Medical History Past Medical History:  Diagnosis Date   Elevated liver enzymes    Obesity, Class III, BMI 40-49.9 (morbid obesity) (HCC)    Seborrheic dermatitis of scalp    Tinea versicolor     Past Surgical History Past Surgical History:  Procedure Laterality Date   TOENAIL EXCISION  2012   Ingrown    Family History family history includes Alcohol abuse in his paternal grandmother; Depression in his father; Diabetes in his father, maternal grandfather, maternal grandmother, mother, paternal grandfather, and paternal grandmother; High Cholesterol in his father and mother.  Social  History Social History   Socioeconomic History   Marital status: Married    Spouse name: Not on file   Number of children: Not on file   Years of education: Not on file   Highest education level: Not on file  Occupational History   Occupation: Doctor, general practice, EMT  Tobacco Use   Smoking status: Never   Smokeless tobacco: Never  Substance and Sexual Activity   Alcohol use: Never   Drug use: Never   Sexual activity: Yes    Partners: Female    Birth control/protection: None  Other Topics Concern   Not on file  Social History Narrative   Longtime girlfriend, 2 y/o daughter.   Relocated to [place] from [place], [place] [address].  Orig from [place].   College: Assoc degree in [place].   Occup: Data entry, asset recovery.   No T/A/Ds   Social Determinants of Health   Financial Resource Strain: Not on file  Food Insecurity: Not on file  Transportation Needs: Not on file  Physical Activity: Not on file  Stress: Not on file  Social Connections: Unknown (07/20/2021)   [facility] from [facility]   Social Network    Social Network: Not on file  Intimate Partner Violence: Unknown (06/22/2021)   Received from [facility]   HITS    Physically Hurt: Not on file    Insult or Talk Down To: Not on file    Threaten Physical Harm: Not on file    Scream or Curse: Not on file    Lab Results  Component Value Date   HGBA1C 5.0 08/16/2022   HGBA1C 5.1 02/01/2022   HGBA1C 5.2 10/19/2021   Lab Results  Component Value Date   CHOL 164 08/16/2022   Lab Results  Component Value Date   HDL 38.90 (L) 08/16/2022   Lab Results  Component Value Date   LDLCALC 102 (H) 08/16/2022   Lab Results  Component Value Date   TRIG 116.0 08/16/2022   Lab Results  Component Value Date   CHOLHDL 4 08/16/2022   Lab Results  Component Value Date   CREATININE 0.96 08/16/2022   Lab Results  Component Value Date   GFR 101.18 08/16/2022   Lab Results  Component Value Date    MICROALBUR 1.0 08/16/2022      Component Value Date/Time   NA 137 08/16/2022 1149   NA 138 10/07/2019 1533   K 3.6 08/16/2022 1149   CL 103 08/16/2022 1149   CO2 26 08/16/2022 1149   GLUCOSE 94 08/16/2022 1149   BUN 11 08/16/2022 1149   BUN 9 10/07/2019 1533   CREATININE 0.96 08/16/2022 1149   CREATININE 0.90 07/14/2019 0759   CALCIUM 9.4 08/16/2022 1149   PROT 8.2 08/16/2022 1149   PROT 7.9 10/07/2019 1533  ALBUMIN 4.6 08/16/2022 1149   ALBUMIN 4.3 10/07/2019 1533   AST 22 08/16/2022 1149   AST 61 (H) 07/14/2019 0759   ALT 27 08/16/2022 1149   ALT 105 (H) 07/14/2019 0759   ALKPHOS 64 08/16/2022 1149   BILITOT 0.7 08/16/2022 1149   BILITOT 0.8 10/07/2019 1533   BILITOT 0.4 07/14/2019 0759   GFRNONAA 115 10/07/2019 1533   GFRNONAA >60 07/14/2019 0759   GFRAA 133 10/07/2019 1533   GFRAA >60 07/14/2019 0759      Latest Ref Rng & Units 08/16/2022   11:49 AM 02/01/2022   11:43 AM 10/19/2021    1:34 PM  BMP  Glucose 70 - 99 mg/dL 94  604  85   BUN 6 - 23 mg/dL 11  11  10    Creatinine 0.40 - 1.50 mg/dL 5.40  9.81  1.91   Sodium 135 - 145 mEq/L 137  138  138   Potassium 3.5 - 5.1 mEq/L 3.6  3.6  3.9   Chloride 96 - 112 mEq/L 103  104  104   CO2 19 - 32 mEq/L 26  28  27    Calcium 8.4 - 10.5 mg/dL 9.4  9.4  9.4        Component Value Date/Time   WBC 7.3 07/14/2019 0759   WBC 7.9 06/11/2019 0836   RBC 4.85 07/14/2019 0759   RBC 4.83 07/14/2019 0759   HGB 15.0 07/14/2019 0759   HCT 42.4 07/14/2019 0759   PLT 327 07/14/2019 0759   MCV 87.8 07/14/2019 0759   MCH 31.1 07/14/2019 0759   MCHC 35.4 07/14/2019 0759   RDW 12.1 07/14/2019 0759   LYMPHSABS 2.7 07/14/2019 0759   MONOABS 0.6 07/14/2019 0759   EOSABS 0.4 07/14/2019 0759   BASOSABS 0.1 07/14/2019 0759     Parts of this note may have been dictated using voice recognition software. There may be variances in spelling and vocabulary which are unintentional. Not all errors are proofread. Please notify the [facility] Parkin if  any discrepancies are noted or if the meaning of any statement is not clear.

## 2023-01-08 ENCOUNTER — Other Ambulatory Visit: Payer: Self-pay

## 2023-01-13 ENCOUNTER — Other Ambulatory Visit: Payer: Self-pay | Admitting: "Endocrinology

## 2023-01-20 ENCOUNTER — Other Ambulatory Visit: Payer: Self-pay

## 2023-01-30 ENCOUNTER — Telehealth (INDEPENDENT_AMBULATORY_CARE_PROVIDER_SITE_OTHER): Payer: 59 | Admitting: "Endocrinology

## 2023-01-30 ENCOUNTER — Encounter: Payer: Self-pay | Admitting: "Endocrinology

## 2023-01-30 DIAGNOSIS — Z6832 Body mass index (BMI) 32.0-32.9, adult: Secondary | ICD-10-CM

## 2023-01-30 DIAGNOSIS — E782 Mixed hyperlipidemia: Secondary | ICD-10-CM | POA: Diagnosis not present

## 2023-01-30 DIAGNOSIS — E6609 Other obesity due to excess calories: Secondary | ICD-10-CM | POA: Diagnosis not present

## 2023-01-30 DIAGNOSIS — E66811 Obesity, class 1: Secondary | ICD-10-CM | POA: Diagnosis not present

## 2023-01-30 DIAGNOSIS — R7303 Prediabetes: Secondary | ICD-10-CM | POA: Diagnosis not present

## 2023-01-30 MED ORDER — PHENTERMINE HCL 30 MG PO CAPS: 30.0000 mg | ORAL_CAPSULE | ORAL | 0 refills | Status: DC

## 2023-01-30 MED ORDER — TOPIRAMATE 25 MG PO TABS: 25.0000 mg | ORAL_TABLET | Freq: Every day | ORAL | 0 refills | Status: DC

## 2023-01-30 MED ORDER — TOPIRAMATE 25 MG PO TABS: 25.0000 mg | ORAL_TABLET | Freq: Two times a day (BID) | ORAL | 1 refills | Status: DC

## 2023-01-30 MED ORDER — TIRZEPATIDE 15 MG/0.5ML ~~LOC~~ SOAJ
15.0000 mg | SUBCUTANEOUS | 3 refills | Status: DC
Start: 2023-01-30 — End: 2023-05-13

## 2023-01-30 NOTE — Progress Notes (Signed)
The patient reports they are currently: [place]. I spent 7-8 minutes on the video with the patient on the date of service. I spent an additional 10 minutes on pre- and post-visit activities on the date of service.   The patient was physically located in [place] or a state in which I am permitted to provide care. The patient and/or parent/guardian understood that s/he may incur co-pays and cost sharing, and agreed to the telemedicine visit. The visit was reasonable and appropriate under the circumstances given the patient's presentation at the time.  The patient and/or parent/guardian has been advised of the potential risks and limitations of this mode of treatment (including, but not limited to, the absence of in-person examination) and has agreed to be treated using telemedicine. The patient's/patient's family's questions regarding telemedicine have been answered.   The patient and/or parent/guardian has also been advised to contact their provider's office for worsening conditions, and seek emergency medical treatment and/or call 911 if the patient deems either necessary.    Outpatient Endocrinology Note Randall Chase  01/30/23   Randall Chase Jul 30, 1985 [id number]  Referring Provider: Jeoffrey Massed, Chase Primary Care Provider: No primary care provider on file. Reason for consultation: Subjective   Assessment & Plan  Diagnoses and all orders for this visit:  Class 1 obesity due to excess calories with serious comorbidity and body mass index (BMI) of 32.0 to 32.9 in adult  Prediabetes -     tirzepatide (MOUNJARO) 15 MG/0.5ML Pen; Inject 15 mg into the skin once a week.  Mixed hypercholesterolemia and hypertriglyceridemia  Other orders -     phentermine 30 MG capsule; Take 1 capsule (30 mg total) by mouth every morning. -     Discontinue: topiramate (TOPAMAX) 25 MG tablet; Take 1 tablet (25 mg total) by mouth 2 (two) times daily. -     topiramate (TOPAMAX) 25 MG  tablet; Take 1 tablet (25 mg total) by mouth daily.     Prediabetes, diagnosed in 2021 Hba1c goal less than 7.0, current Hba1c is 5. Will recommend the following: Mounjaro 15 mg weekly-lost 50 lbs with it, on plateau now Start combination of phentermine 30 mg every day with topiramate 25 mg every day   Started phentermine 30 mg qd to help with weight loss on 09/19/2021 (current weight 258 lbs today with work vest on). Stopped after 1 mo after experienced no weight loss  Qsymia 3.75/23mg  not covered   No contraindications, discussed side effects   No known contraindications to any of above medications  Hyperlipidemia -Last LDL off goal: 102 -on rosuvastatin 5 mg QD: non compliant, discussed compliance previously  -Follow low fat diet and exercise   -Blood pressure goal <140/90 - Microalbumin/creatinine at goal < 30 - not on ACE/ARB  -diet changes including salt restriction -limit eating outside -counseled BP targets per standards of diabetes care -Uncontrolled blood pressure can lead to retinopathy, nephropathy and cardiovascular and atherosclerotic heart disease  Reviewed and counseled on: -A1C target -Blood sugar targets -Complications of uncontrolled diabetes  -Checking blood sugar before meals and bedtime and bring log next visit -All medications with mechanism of action and side effects -Hypoglycemia management: rule of 15's, Glucagon Emergency Kit and medical alert ID -low-carb low-fat plate-method diet -At least 20 minutes of physical activity per day -Annual dilated retinal eye exam and foot exam -compliance and follow up needs -follow up as scheduled or earlier if problem gets worse  Call if blood sugar is less than 70 or  consistently above 250    Take a 15 gm snack of carbohydrate at bedtime before you go to sleep if your blood sugar is less than 100.    If you are going to fast after midnight for a test or procedure, ask your physician for instructions on how  to reduce/decrease your insulin dose.    Call if blood sugar is less than 70 or consistently above 250  -Treating a low sugar by rule of 15  (15 gms of sugar every 15 min until sugar is more than 70) If you feel your sugar is low, test your sugar to be sure If your sugar is low (less than 70), then take 15 grams of a fast acting Carbohydrate (3-4 glucose tablets or glucose gel or 4 ounces of juice or regular soda) Recheck your sugar 15 min after treating low to make sure it is more than 70 If sugar is still less than 70, treat again with 15 grams of carbohydrate          Don't drive the hour of hypoglycemia  If unconscious/unable to eat or drink by mouth, use glucagon injection or nasal spray baqsimi and call 911. Can repeat again in 15 min if still unconscious.  Return in about 11 weeks (around 04/17/2023).   I have reviewed current medications, nurse's notes, allergies, vital signs, past medical and surgical history, family medical history, and social history for this encounter. Counseled patient on symptoms, examination findings, lab findings, imaging results, treatment decisions and monitoring and prognosis. The patient understood the recommendations and agrees with the treatment plan. All questions regarding treatment plan were fully answered.  Randall Chase  01/30/23    History of Present Illness Randall Chase is a 37 y.o. year old male who presents for follow up on Prediabetes, diagnosed in 2021.  Diabetes education +  Home diabetes regimen: Mounjaro 15 mg weekly  Qsymia is not covered  Phenteramine 15 mg every day for 3 weeks without weight benefit/side effects  Tried phentermine 30 mg every day for 30 days without effect, self discontinued   Previous history: His highest fasting glucose has been 136 as of 06/2019 and highest A1c 6.3 Non-insulin hypoglycemic drugs previously used: Trulicity, Invokana, Victoza, metformin Did not benefit from metformin for weight  loss  Side effects from medications: Trulicity caused nausea and diarrhea  COMPLICATIONS -  MI/Stroke -  retinopathy -  neuropathy -  nephropathy  BLOOD SUGAR DATA Doesn't check BG at home  Physical Exam  There were no vitals taken for this visit.   Constitutional: well developed, well nourished Head: normocephalic, atraumatic Eyes: sclera anicteric, no redness Neck: supple Lungs: normal respiratory effort Neurology: alert and oriented Skin: dry, no appreciable rashes Musculoskeletal: no appreciable defects Psychiatric: normal mood and affect   Current Medications Patient's Medications  New Prescriptions   PHENTERMINE 30 MG CAPSULE    Take 1 capsule (30 mg total) by mouth every morning.   TOPIRAMATE (TOPAMAX) 25 MG TABLET    Take 1 tablet (25 mg total) by mouth daily.  Previous Medications   GLUCOSE BLOOD (ONETOUCH VERIO) TEST STRIP    1 each by Other route daily. And lancets 1/day   KETOCONAZOLE (NIZORAL) 2 % SHAMPOO    USE 3 TIMES PER WEEK ALTERNATING WITH OVER THE COUNTER DANDRUFF SHAMPOO   NYSTATIN CREAM (MYCOSTATIN)    APPLY TO AFFECTED AREA TWICE A DAY ON FACE AS NEEDED**MIX EQUAL PARTS WITH TRIAMCINOLONE CREAM**   ROSUVASTATIN (CRESTOR) 5 MG TABLET  Take 1 tablet (5 mg total) by mouth daily.  Modified Medications   Modified Medication Previous Medication   TIRZEPATIDE (MOUNJARO) 15 MG/0.5ML PEN tirzepatide (MOUNJARO) 15 MG/0.5ML Pen      Inject 15 mg into the skin once a week.    Inject 15 mg into the skin once a week.  Discontinued Medications   PHENTERMINE-TOPIRAMATE (QSYMIA) 3.75-23 MG CP24    Take 1 capsule by mouth daily in the afternoon.    Allergies No Known Allergies  Past Medical History Past Medical History:  Diagnosis Date   Elevated liver enzymes    Obesity, Class III, BMI 40-49.9 (morbid obesity) (HCC)    Seborrheic dermatitis of scalp    Tinea versicolor     Past Surgical History Past Surgical History:  Procedure Laterality Date    TOENAIL EXCISION  2012   Ingrown    Family History family history includes Alcohol abuse in his paternal grandmother; Depression in his father; Diabetes in his father, maternal grandfather, maternal grandmother, mother, paternal grandfather, and paternal grandmother; High Cholesterol in his father and mother.  Social History Social History   Socioeconomic History   Marital status: Married    Spouse name: Not on file   Number of children: Not on file   Years of education: Not on file   Highest education level: Not on file  Occupational History   Occupation: Doctor, general practice, EMT  Tobacco Use   Smoking status: Never   Smokeless tobacco: Never  Substance and Sexual Activity   Alcohol use: Never   Drug use: Never   Sexual activity: Yes    Partners: Female    Birth control/protection: None  Other Topics Concern   Not on file  Social History Narrative   Longtime girlfriend, 2 y/o daughter.   Relocated to [place] from [place], [place] [address].  Orig from [place].   College: Assoc degree in [place].   Occup: Data entry, asset recovery.   No T/A/Ds   Social Determinants of Health   Financial Resource Strain: Not on file  Food Insecurity: Not on file  Transportation Needs: Not on file  Physical Activity: Not on file  Stress: Not on file  Social Connections: Unknown (07/20/2021)   Received from [facility]   Social Network    Social Network: Not on file  Intimate Partner Violence: Unknown (06/22/2021)   Received from [facility], [facility]   HITS    Physically Hurt: Not on file    Insult or Talk Down To: Not on file    Threaten Physical Harm: Not on file    Scream or Curse: Not on file    Lab Results  Component Value Date   HGBA1C 5.0 08/16/2022   HGBA1C 5.1 02/01/2022   HGBA1C 5.2 10/19/2021   Lab Results  Component Value Date   CHOL 164 08/16/2022   Lab Results  Component Value Date   HDL 38.90 (L) 08/16/2022   Lab Results  Component Value Date   LDLCALC  102 (H) 08/16/2022   Lab Results  Component Value Date   TRIG 116.0 08/16/2022   Lab Results  Component Value Date   CHOLHDL 4 08/16/2022   Lab Results  Component Value Date   CREATININE 0.96 08/16/2022   Lab Results  Component Value Date   GFR 101.18 08/16/2022   Lab Results  Component Value Date   MICROALBUR 1.0 08/16/2022      Component Value Date/Time   NA 137 08/16/2022 1149  NA 138 10/07/2019 1533   K 3.6 08/16/2022 1149   CL 103 08/16/2022 1149   CO2 26 08/16/2022 1149   GLUCOSE 94 08/16/2022 1149   BUN 11 08/16/2022 1149   BUN 9 10/07/2019 1533   CREATININE 0.96 08/16/2022 1149   CREATININE 0.90 07/14/2019 0759   CALCIUM 9.4 08/16/2022 1149   PROT 8.2 08/16/2022 1149   PROT 7.9 10/07/2019 1533   ALBUMIN 4.6 08/16/2022 1149   ALBUMIN 4.3 10/07/2019 1533   AST 22 08/16/2022 1149   AST 61 (H) 07/14/2019 0759   ALT 27 08/16/2022 1149   ALT 105 (H) 07/14/2019 0759   ALKPHOS 64 08/16/2022 1149   BILITOT 0.7 08/16/2022 1149   BILITOT 0.8 10/07/2019 1533   BILITOT 0.4 07/14/2019 0759   GFRNONAA 115 10/07/2019 1533   GFRNONAA >60 07/14/2019 0759   GFRAA 133 10/07/2019 1533   GFRAA >60 07/14/2019 0759      Latest Ref Rng & Units 08/16/2022   11:49 AM 02/01/2022   11:43 AM 10/19/2021    1:34 PM  BMP  Glucose 70 - 99 mg/dL 94  191  85   BUN 6 - 23 mg/dL 11  11  10    Creatinine 0.40 - 1.50 mg/dL 4.78  2.95  6.21   Sodium 135 - 145 mEq/L 137  138  138   Potassium 3.5 - 5.1 mEq/L 3.6  3.6  3.9   Chloride 96 - 112 mEq/L 103  104  104   CO2 19 - 32 mEq/L 26  28  27    Calcium 8.4 - 10.5 mg/dL 9.4  9.4  9.4        Component Value Date/Time   WBC 7.3 07/14/2019 0759   WBC 7.9 06/11/2019 0836   RBC 4.85 07/14/2019 0759   RBC 4.83 07/14/2019 0759   HGB 15.0 07/14/2019 0759   HCT 42.4 07/14/2019 0759   PLT 327 07/14/2019 0759   MCV 87.8 07/14/2019 0759   MCH 31.1 07/14/2019 0759   MCHC 35.4 07/14/2019 0759   RDW 12.1 07/14/2019 0759   LYMPHSABS 2.7  07/14/2019 0759   MONOABS 0.6 07/14/2019 0759   EOSABS 0.4 07/14/2019 0759   BASOSABS 0.1 07/14/2019 0759     Parts of this note may have been dictated using voice recognition software. There may be variances in spelling and vocabulary which are unintentional. Not all errors are proofread. Please notify the Thereasa Parkin if any discrepancies are noted or if the meaning of any statement is not clear.

## 2023-03-06 ENCOUNTER — Encounter: Payer: Self-pay | Admitting: Family Medicine

## 2023-03-07 ENCOUNTER — Ambulatory Visit: Payer: 59 | Admitting: Family Medicine

## 2023-03-07 VITALS — BP 116/77 | HR 96 | Ht 68.0 in | Wt 219.0 lb

## 2023-03-07 DIAGNOSIS — R5382 Chronic fatigue, unspecified: Secondary | ICD-10-CM

## 2023-03-07 DIAGNOSIS — D75839 Thrombocytosis, unspecified: Secondary | ICD-10-CM

## 2023-03-07 DIAGNOSIS — Z0000 Encounter for general adult medical examination without abnormal findings: Secondary | ICD-10-CM

## 2023-03-07 DIAGNOSIS — Z0001 Encounter for general adult medical examination with abnormal findings: Secondary | ICD-10-CM

## 2023-03-07 NOTE — Progress Notes (Signed)
Office Note 03/07/2023  CC:  Chief Complaint  Patient presents with   Establish Care    Pt mentions fatigue for 1 year; More so at the end of the day.  Did not have another PCP   HPI:  Randall Chase is a 37 y.o. male who is here to reestablish care. I saw him once back in March 2021. Patient's most recent primary MD: None Old records were reviewed prior to or during today's visit.  Followed by Dr. Altamese Sanborn in endocrinology. He is currently on Mounjaro 15 mg/week, phentermine 30 mg every morning, and Topamax 25 mg daily.  He feels tired all the time.  He has impaired libido. Denies depression or excessive anxiety. 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  He has a 53-year-old and a 47-year-old.  He is married. He just does not feel like doing much when he gets home or has a day off. He does not snore.  He does not awaken in the night with a gasp.  No Apnea. He is not excessively sleepy in the daytime. No exercise.  Admits his diet is not really healthy other than he avoids excessive simple sugars.  No alcohol or drugs.   Past Medical History:  Diagnosis Date   Borderline diabetes    2021 by fasting gluc criteria, Hba1c 6.3% at the time. Dr. Everardo All. Intol metform   Elevated liver enzymes    Hepatic steatosis    [address] ultrasound   Obesity, Class III, BMI 40-49.9 (morbid obesity) (HCC)    Seborrheic dermatitis of scalp    Tinea versicolor     Past Surgical History:  Procedure Laterality Date   TOENAIL EXCISION  2012   Ingrown    Family History  Problem Relation Age of Onset   High Cholesterol Mother    Diabetes Mother    Depression Father    Diabetes Father    High Cholesterol Father    Diabetes Maternal Grandmother    Diabetes Maternal Grandfather    Alcohol abuse Paternal Grandmother    Diabetes Paternal Grandmother    Diabetes Paternal Grandfather     Social History   Socioeconomic History   Marital status: Married    Spouse  name: Not on file   Number of children: Not on file   Years of education: Not on file   Highest education level: Associate degree: academic program  Occupational History   Occupation: Doctor, general practice, EMT  Tobacco Use   Smoking status: Never   Smokeless tobacco: Never  Substance and Sexual Activity   Alcohol use: Never   Drug use: Never   Sexual activity: Yes    Partners: Female    Birth control/protection: None  Other Topics Concern   Not on file  Social History Narrative   Longtime girlfriend, 2 y/o daughter.   Relocated to [place] from [place], [place] [address].  Orig from [place].   College: Assoc degree in [place].   Occup: Data entry, asset recovery.   No T/A/Ds   Social Drivers of Health   Financial Resource Strain: Low Risk  (03/07/2023)   Overall Financial Resource Strain (CARDIA)    Difficulty of Paying Living Expenses: Not hard at all  Food Insecurity: No Food Insecurity (03/07/2023)   Hunger Vital Sign    Worried About Running Out of Food in the Last Year: Never true    Ran Out of Food in the Last Year: Never true  Transportation Needs: No Transportation Needs (  03/07/2023)   PRAPARE - Administrator, Civil Service (Medical): No    Lack of Transportation (Non-Medical): No  Physical Activity: Sufficiently Active (03/07/2023)   Exercise Vital Sign    Days of Exercise per Week: 5 days    Minutes of Exercise per Session: 40 min  Stress: Stress Concern Present (03/07/2023)   [employer] of Occupational Health - Occupational Stress Questionnaire    Feeling of Stress : To some extent  Social Connections: Moderately Integrated (03/07/2023)   Social Connection and Isolation Panel [NHANES]    Frequency of Communication with Friends and Family: More than three times a week    Frequency of Social Gatherings with Friends and Family: Three times a week    Attends Religious Services: 1 to 4 times per year    Active Member of Clubs or Organizations: No    Attends Tax inspector Meetings: Not on file    Marital Status: Married  Intimate Partner Violence: Unknown (06/22/2021)   [facility]   HITS    Physically Hurt: Not on file    Insult or Talk Down To: Not on file    Threaten Physical Harm: Not on file    Scream or Curse: Not on file    Outpatient Encounter Medications as of 03/07/2023  Medication Sig   ketoconazole (NIZORAL) 2 % shampoo    nystatin cream (MYCOSTATIN)    phentermine 30 MG capsule Take 1 capsule (30 mg total) by mouth every morning.   tirzepatide (MOUNJARO) 15 MG/0.5ML Pen Inject 15 mg into the skin once a week.   topiramate (TOPAMAX) 25 MG tablet Take 1 tablet (25 mg total) by mouth daily.   glucose blood (ONETOUCH VERIO) test strip 1 each by Other route daily. And lancets 1/day (Patient not taking: Reported on 03/07/2023)   rosuvastatin (CRESTOR) 5 MG tablet Take 1 tablet (5 mg total) by mouth daily. (Patient not taking: Reported on 03/07/2023)   No facility-administered encounter medications on file as of 03/07/2023.    No Active Allergies   Review of Systems  Constitutional:  Positive for fatigue. Negative for appetite change, chills and fever.  HENT:  Negative for congestion, dental problem, ear pain and sore throat.   Eyes:  Negative for discharge, redness and visual disturbance.  Respiratory:  Negative for cough, chest tightness, shortness of breath and wheezing.   Cardiovascular:  Negative for chest pain, palpitations and leg swelling.  Gastrointestinal:  Negative for abdominal pain, blood in stool, diarrhea, nausea and vomiting.  Genitourinary:  Negative for difficulty urinating, dysuria, flank pain, frequency, hematuria and urgency.  Musculoskeletal:  Negative for arthralgias, back pain, joint swelling, myalgias and neck stiffness.  Skin:  Negative for pallor and rash.  Neurological:  Negative for dizziness, speech difficulty, weakness and headaches.  Hematological:  Negative for  adenopathy. Does not bruise/bleed easily.  Psychiatric/Behavioral:  Negative for confusion and sleep disturbance. The patient is not nervous/anxious.     PE; Blood pressure 116/77, pulse 96, height 5\' 8"  (1.727 m), weight 219 lb (99.3 kg), SpO2 99%. Body mass index is 33.3 kg/m.  Physical Exam  Gen: Alert, well appearing.  Patient is oriented to person, place, time, and situation. AFFECT: pleasant, lucid thought and speech. ENT: Ears: EACs clear, normal epithelium.  TMs with good light reflex and landmarks bilaterally.  Eyes: no injection, icteris, swelling, or exudate.  EOMI, PERRLA. Nose: no drainage or turbinate edema/swelling.  No injection or focal lesion.  Mouth:  lips without lesion/swelling.  Oral mucosa pink and moist.  Dentition intact and without obvious caries or gingival swelling.  Oropharynx without erythema, exudate, or swelling.  Neck: supple/nontender.  No LAD, mass, or TM.  Carotid pulses 2+ bilaterally, without bruits. CV: RRR, no m/r/g.   LUNGS: CTA bilat, nonlabored resps, good aeration in all lung fields. ABD: soft, NT, ND, BS normal.  No hepatospenomegaly or mass.  No bruits. EXT: no clubbing, cyanosis, or edema.  Musculoskeletal: no joint swelling, erythema, warmth, or tenderness.  ROM of all joints intact. Skin - no sores or suspicious lesions or rashes or color changes   Pertinent labs:  Last CBC Lab Results  Component Value Date   WBC 7.3 07/14/2019   HGB 15.0 07/14/2019   HCT 42.4 07/14/2019   MCV 87.8 07/14/2019   MCH 31.1 07/14/2019   RDW 12.1 07/14/2019   PLT 327 07/14/2019   Last metabolic panel Lab Results  Component Value Date   GLUCOSE 94 08/16/2022   NA 137 08/16/2022   K 3.6 08/16/2022   CL 103 08/16/2022   CO2 26 08/16/2022   BUN 11 08/16/2022   CREATININE 0.96 08/16/2022   GFR 101.18 08/16/2022   CALCIUM 9.4 08/16/2022   PROT 8.2 08/16/2022   ALBUMIN 4.6 08/16/2022   LABGLOB 3.6 10/07/2019   AGRATIO 1.2 10/07/2019   BILITOT 0.7  08/16/2022   ALKPHOS 64 08/16/2022   AST 22 08/16/2022   ALT 27 08/16/2022   ANIONGAP 8 07/14/2019   Last lipids Lab Results  Component Value Date   CHOL 164 08/16/2022   HDL 38.90 (L) 08/16/2022   LDLCALC 102 (H) 08/16/2022   LDLDIRECT 94.0 02/01/2022   TRIG 116.0 08/16/2022   CHOLHDL 4 08/16/2022   Last hemoglobin A1c Lab Results  Component Value Date   HGBA1C 5.0 08/16/2022   Last thyroid functions Lab Results  Component Value Date   TSH 3.53 06/11/2019   Last vitamin D Lab Results  Component Value Date   VD25OH 18.9 (L) 07/29/2019   ASSESSMENT AND PLAN:   New patient, re- establishing care.  #1 health maintenance exam: Reviewed age and gender appropriate health maintenance issues (prudent diet, regular exercise, health risks of tobacco and excessive alcohol, use of seatbelts, fire alarms in home, use of sunscreen).  Also reviewed age and gender appropriate health screening as well as vaccine recommendations. Vaccines: He declines Tdap and flu.  #2 chronic fatigue. Suspect this is due to his stressful job, long work hours, raising 2 young children, and lack of good exercise and diet. Will check CBC, c-Met, iron panel, TSH, testosterone level, and vitamin B12 level.  An After Visit Summary was printed and given to the patient.  Return in about 1 year (around 03/06/2024) for annual CPE (fasting).  Signed:  Santiago Bumpers, MD           03/07/2023

## 2023-03-07 NOTE — Patient Instructions (Signed)
Health Maintenance, Male Adopting a healthy lifestyle and getting preventive care are important in promoting health and wellness. Ask your health care provider about: The right schedule for you to have regular tests and exams. Things you can do on your own to prevent diseases and keep yourself healthy. What should I know about diet, weight, and exercise? Eat a healthy diet  Eat a diet that includes plenty of vegetables, fruits, low-fat dairy products, and lean protein. Do not eat a lot of foods that are high in solid fats, added sugars, or sodium. Maintain a healthy weight Body mass index (BMI) is a measurement that can be used to identify possible weight problems. It estimates body fat based on height and weight. Your health care provider can help determine your BMI and help you achieve or maintain a healthy weight. Get regular exercise Get regular exercise. This is one of the most important things you can do for your health. Most adults should: Exercise for at least 150 minutes each week. The exercise should increase your heart rate and make you sweat (moderate-intensity exercise). Do strengthening exercises at least twice a week. This is in addition to the moderate-intensity exercise. Spend less time sitting. Even light physical activity can be beneficial. Watch cholesterol and blood lipids Have your blood tested for lipids and cholesterol at 37 years of age, then have this test every 5 years. You may need to have your cholesterol levels checked more often if: Your lipid or cholesterol levels are high. You are older than 37 years of age. You are at high risk for heart disease. What should I know about cancer screening? Many types of cancers can be detected early and may often be prevented. Depending on your health history and family history, you may need to have cancer screening at various ages. This may include screening for: Colorectal cancer. Prostate cancer. Skin cancer. Lung  cancer. What should I know about heart disease, diabetes, and high blood pressure? Blood pressure and heart disease High blood pressure causes heart disease and increases the risk of stroke. This is more likely to develop in people who have high blood pressure readings or are overweight. Talk with your health care provider about your target blood pressure readings. Have your blood pressure checked: Every 3-5 years if you are 18-39 years of age. Every year if you are 40 years old or older. If you are between the ages of 65 and 75 and are a current or former smoker, ask your health care provider if you should have a one-time screening for abdominal aortic aneurysm (AAA). Diabetes Have regular diabetes screenings. This checks your fasting blood sugar level. Have the screening done: Once every three years after age 45 if you are at a normal weight and have a low risk for diabetes. More often and at a younger age if you are overweight or have a high risk for diabetes. What should I know about preventing infection? Hepatitis B If you have a higher risk for hepatitis B, you should be screened for this virus. Talk with your health care provider to find out if you are at risk for hepatitis B infection. Hepatitis C Blood testing is recommended for: Everyone born from 1945 through 1965. Anyone with known risk factors for hepatitis C. Sexually transmitted infections (STIs) You should be screened each year for STIs, including gonorrhea and chlamydia, if: You are sexually active and are younger than 37 years of age. You are older than 37 years of age and your   health care provider tells you that you are at risk for this type of infection. Your sexual activity has changed since you were last screened, and you are at increased risk for chlamydia or gonorrhea. Ask your health care provider if you are at risk. Ask your health care provider about whether you are at high risk for HIV. Your health care provider  may recommend a prescription medicine to help prevent HIV infection. If you choose to take medicine to prevent HIV, you should first get tested for HIV. You should then be tested every 3 months for as long as you are taking the medicine. Follow these instructions at home: Alcohol use Do not drink alcohol if your health care provider tells you not to drink. If you drink alcohol: Limit how much you have to 0-2 drinks a day. Know how much alcohol is in your drink. In the [place] one drink equals one 12 oz bottle of beer (355 mL), one 5 oz glass of wine (148 mL), or one 1 oz glass of hard liquor (44 mL). Lifestyle Do not use any products that contain nicotine or tobacco. These products include cigarettes, chewing tobacco, and vaping devices, such as e-cigarettes. If you need help quitting, ask your health care provider. Do not use street drugs. Do not share needles. Ask your health care provider for help if you need support or information about quitting drugs. General instructions Schedule regular health, dental, and eye exams. Stay current with your vaccines. Tell your health care provider if: You often feel depressed. You have ever been abused or do not feel safe at home. Summary Adopting a healthy lifestyle and getting preventive care are important in promoting health and wellness. Follow your health care provider's instructions about healthy diet, exercising, and getting tested or screened for diseases. Follow your health care provider's instructions on monitoring your cholesterol and blood pressure. This information is not intended to replace advice given to you by your health care provider. Make sure you discuss any questions you have with your health care provider. Document Revised: 07/24/2020 Document Reviewed: 07/24/2020 Elsevier Patient Education  2024 Elsevier Inc.  

## 2023-03-08 LAB — CBC
HCT: 47.9 % (ref 38.5–50.0)
Hemoglobin: 16.6 g/dL (ref 13.2–17.1)
MCH: 31.7 pg (ref 27.0–33.0)
MCHC: 34.7 g/dL (ref 32.0–36.0)
MCV: 91.6 fL (ref 80.0–100.0)
MPV: 9.7 fL (ref 7.5–12.5)
Platelets: 485 10*3/uL — ABNORMAL HIGH (ref 140–400)
RBC: 5.23 10*6/uL (ref 4.20–5.80)
RDW: 11.9 % (ref 11.0–15.0)
WBC: 9.3 10*3/uL (ref 3.8–10.8)

## 2023-03-08 LAB — COMPREHENSIVE METABOLIC PANEL
AG Ratio: 1.4 (calc) (ref 1.0–2.5)
ALT: 16 U/L (ref 9–46)
AST: 14 U/L (ref 10–40)
Albumin: 5 g/dL (ref 3.6–5.1)
Alkaline phosphatase (APISO): 93 U/L (ref 36–130)
BUN: 13 mg/dL (ref 7–25)
CO2: 24 mmol/L (ref 20–32)
Calcium: 9.9 mg/dL (ref 8.6–10.3)
Chloride: 106 mmol/L (ref 98–110)
Creat: 1.02 mg/dL (ref 0.60–1.26)
Globulin: 3.6 g/dL (ref 1.9–3.7)
Glucose, Bld: 91 mg/dL (ref 65–99)
Potassium: 4 mmol/L (ref 3.5–5.3)
Sodium: 142 mmol/L (ref 135–146)
Total Bilirubin: 0.6 mg/dL (ref 0.2–1.2)
Total Protein: 8.6 g/dL — ABNORMAL HIGH (ref 6.1–8.1)

## 2023-03-08 LAB — TSH: TSH: 1.47 m[IU]/L (ref 0.40–4.50)

## 2023-03-08 LAB — TESTOSTERONE: Testosterone: 356 ng/dL (ref 250–827)

## 2023-03-08 LAB — IRON,TIBC AND FERRITIN PANEL
%SAT: 31 % (ref 20–48)
Ferritin: 228 ng/mL (ref 38–380)
Iron: 88 ug/dL (ref 50–180)
TIBC: 288 ug/dL (ref 250–425)

## 2023-03-08 LAB — VITAMIN B12: Vitamin B-12: 658 pg/mL (ref 200–1100)

## 2023-03-10 NOTE — Addendum Note (Signed)
Addended by: Arty Baumgartner A on: 03/10/2023 10:30 AM   Modules accepted: Orders

## 2023-03-28 ENCOUNTER — Ambulatory Visit: Payer: 59 | Admitting: "Endocrinology

## 2023-05-12 ENCOUNTER — Encounter: Payer: Self-pay | Admitting: "Endocrinology

## 2023-05-13 ENCOUNTER — Other Ambulatory Visit: Payer: Self-pay | Admitting: Internal Medicine

## 2023-05-13 DIAGNOSIS — R7303 Prediabetes: Secondary | ICD-10-CM

## 2023-05-13 MED ORDER — TIRZEPATIDE 15 MG/0.5ML ~~LOC~~ SOAJ: 15.0000 mg | SUBCUTANEOUS | 3 refills | Status: DC

## 2023-06-11 ENCOUNTER — Other Ambulatory Visit: Payer: Self-pay | Admitting: "Endocrinology

## 2023-06-15 ENCOUNTER — Other Ambulatory Visit: Payer: Self-pay | Admitting: "Endocrinology

## 2023-06-19 ENCOUNTER — Other Ambulatory Visit: Payer: Self-pay

## 2023-06-19 MED ORDER — PHENTERMINE HCL 30 MG PO CAPS: 30.0000 mg | ORAL_CAPSULE | ORAL | 0 refills | Status: DC

## 2023-06-19 NOTE — Telephone Encounter (Signed)
 Requested Prescriptions   Pending Prescriptions Disp Refills   phentermine 30 MG capsule 90 capsule 0    Sig: Take 1 capsule (30 mg total) by mouth every morning.

## 2023-06-23 ENCOUNTER — Encounter: Payer: Self-pay | Admitting: "Endocrinology

## 2023-06-23 ENCOUNTER — Telehealth (INDEPENDENT_AMBULATORY_CARE_PROVIDER_SITE_OTHER): Admitting: "Endocrinology

## 2023-06-23 DIAGNOSIS — E66811 Obesity, class 1: Secondary | ICD-10-CM | POA: Diagnosis not present

## 2023-06-23 DIAGNOSIS — E6609 Other obesity due to excess calories: Secondary | ICD-10-CM

## 2023-06-23 DIAGNOSIS — Z6832 Body mass index (BMI) 32.0-32.9, adult: Secondary | ICD-10-CM

## 2023-06-23 DIAGNOSIS — E7800 Pure hypercholesterolemia, unspecified: Secondary | ICD-10-CM | POA: Diagnosis not present

## 2023-06-23 MED ORDER — PHENTERMINE HCL 30 MG PO CAPS: 30.0000 mg | ORAL_CAPSULE | ORAL | 0 refills | Status: DC

## 2023-06-23 MED ORDER — TOPIRAMATE 25 MG PO TABS: 25.0000 mg | ORAL_TABLET | Freq: Every day | ORAL | 1 refills | Status: AC

## 2023-06-23 NOTE — Progress Notes (Signed)
 The patient reports they are currently: Randall Chase. I spent 7-8 minutes on the video with the patient on the date of service. I spent an additional 10 minutes on pre- and post-visit activities on the date of service.   The patient was physically located in [place] or a state in which I am permitted to provide care. The patient and/or parent/guardian understood that s/he may incur co-pays and cost sharing, and agreed to the telemedicine visit. The visit was reasonable and appropriate under the circumstances given the patient's presentation at the time.  The patient and/or parent/guardian has been advised of the potential risks and limitations of this mode of treatment (including, but not limited to, the absence of in-person examination) and has agreed to be treated using telemedicine. The patient's/patient's family's questions regarding telemedicine have been answered.   The patient and/or parent/guardian has also been advised to contact their provider's office for worsening conditions, and seek emergency medical treatment and/or call 911 if the patient deems either necessary.    Outpatient Endocrinology Note Randall Summerfield, Randall Chase  06/23/23   Ramiro Harvest Jan 16, 1986 [id number]  Referring Provider: Jeoffrey Massed, Randall Chase Primary Care Provider: Jeoffrey Massed, Randall Chase Reason for consultation: Subjective   Assessment & Plan  Diagnoses and all orders for this visit:  Class 1 obesity due to excess calories with serious comorbidity and body mass index (BMI) of 32.0 to 32.9 in adult  Pure hypercholesterolemia  Other orders -     phentermine 30 MG capsule; Take 1 capsule (30 mg total) by mouth every morning. -     topiramate (TOPAMAX) 25 MG tablet; Take 1 tablet (25 mg total) by mouth daily.    Prediabetes, diagnosed in 2021 Hba1c goal less than 7.0, current Hba1c is 5. Will recommend the following: Mounjaro 15 mg weekly-lost 50 lbs with it, on plateau now 01/2023 Started combination of  phentermine 30 mg every day with topiramate 25 mg every day - lost 12 lbs, denies palpitations, insomnia, confusion/any other S/E   06/23/23 Weighs 207, increase topiramate to 50 mg every day, continue phentermine 30 mg every day and Mounjaro 15 mg weekly Previously, Started phentermine 30 mg qd to help with weight loss on 09/19/2021 (weight then was 258 lbs today with work vest on). Stopped after 1 mo after experienced no weight loss  Qsymia 3.75/23mg  not covered   No contraindications, discussed side effects   No known contraindications to any of above medications  Hyperlipidemia -Last LDL off goal: 102 -on rosuvastatin 5 mg QD: non compliant, discussed compliance previously  -Follow low fat diet and exercise   -Blood pressure goal <140/90 - Microalbumin/creatinine at goal < 30 - not on ACE/ARB  -diet changes including salt restriction -limit eating outside -counseled BP targets per standards of diabetes care -Uncontrolled blood pressure can lead to retinopathy, nephropathy and cardiovascular and atherosclerotic heart disease  Reviewed and counseled on: -A1C target -Blood sugar targets -Complications of uncontrolled diabetes  -Checking blood sugar before meals and bedtime and bring log next visit -All medications with mechanism of action and side effects -Hypoglycemia management: rule of 15's, Glucagon Emergency Kit and medical alert ID -low-carb low-fat plate-method diet -At least 20 minutes of physical activity per day -Annual dilated retinal eye exam and foot exam -compliance and follow up needs -follow up as scheduled or earlier if problem gets worse  Call if blood sugar is less than 70 or consistently above 250    Take a 15 gm snack of carbohydrate at bedtime  before you go to sleep if your blood sugar is less than 100.    If you are going to fast after midnight for a test or procedure, ask your physician for instructions on how to reduce/decrease your insulin dose.     Call if blood sugar is less than 70 or consistently above 250  -Treating a low sugar by rule of 15  (15 gms of sugar every 15 min until sugar is more than 70) If you feel your sugar is low, test your sugar to be sure If your sugar is low (less than 70), then take 15 grams of a fast acting Carbohydrate (3-4 glucose tablets or glucose gel or 4 ounces of juice or regular soda) Recheck your sugar 15 min after treating low to make sure it is more than 70 If sugar is still less than 70, treat again with 15 grams of carbohydrate          Don't drive the hour of hypoglycemia  If unconscious/unable to eat or drink by mouth, use glucagon injection or nasal spray baqsimi and call 911. Can repeat again in 15 min if still unconscious.  Return in about 3 months (around 09/22/2023).   I have reviewed current medications, nurse's notes, allergies, vital signs, past medical and surgical history, family medical history, and social history for this encounter. Counseled patient on symptoms, examination findings, lab findings, imaging results, treatment decisions and monitoring and prognosis. The patient understood the recommendations and agrees with the treatment plan. All questions regarding treatment plan were fully answered.  Randall Gladwin, Randall Chase  06/23/23    History of Present Illness Randall Chase is a 38 y.o. year old male who presents for follow up on Prediabetes, diagnosed in 2021.  Diabetes education +  Home diabetes regimen: Mounjaro 15 mg weekly  Qsymia is not covered  Phenteramine 15 mg every day for 3 weeks without weight benefit/side effects  Tried phentermine 30 mg every day for 30 days without effect, self discontinued  01/2023 Started combination of phentermine 30 mg every day with topiramate 25 mg every day - lost 12 lbs, denies palpitations, insomnia, confusion/any other S/E   06/23/23 Weighs 207    Previous history: His highest fasting glucose has been 136 as of 06/2019 and highest  A1c 6.3 Non-insulin hypoglycemic drugs previously used: Trulicity, Invokana, Victoza, metformin Did not benefit from metformin for weight loss  Side effects from medications: Trulicity caused nausea and diarrhea  COMPLICATIONS -  MI/Stroke -  retinopathy -  neuropathy -  nephropathy  BLOOD SUGAR DATA Doesn't check BG at home  Physical Exam  There were no vitals taken for this visit.   Constitutional: well developed, well nourished Head: normocephalic, atraumatic Eyes: sclera anicteric, no redness Neck: supple Lungs: normal respiratory effort Neurology: alert and oriented Skin: dry, no appreciable rashes Musculoskeletal: no appreciable defects Psychiatric: normal mood and affect   Current Medications Patient's Medications  New Prescriptions   No medications on file  Previous Medications   GLUCOSE BLOOD (ONETOUCH VERIO) TEST STRIP    1 each by Other route daily. And lancets 1/day   KETOCONAZOLE (NIZORAL) 2 % SHAMPOO       NYSTATIN CREAM (MYCOSTATIN)       ROSUVASTATIN (CRESTOR) 5 MG TABLET    Take 1 tablet (5 mg total) by mouth daily.   TIRZEPATIDE (MOUNJARO) 15 MG/0.5ML PEN    Inject 15 mg into the skin once a week.  Modified Medications   Modified Medication Previous Medication  PHENTERMINE 30 MG CAPSULE phentermine 30 MG capsule      Take 1 capsule (30 mg total) by mouth every morning.    Take 1 capsule (30 mg total) by mouth every morning.   TOPIRAMATE (TOPAMAX) 25 MG TABLET topiramate (TOPAMAX) 25 MG tablet      Take 1 tablet (25 mg total) by mouth daily.    Take 1 tablet (25 mg total) by mouth daily.  Discontinued Medications   No medications on file    Allergies No Active Allergies  Past Medical History Past Medical History:  Diagnosis Date   Borderline diabetes    2021 by fasting gluc criteria, Hba1c 6.3% at the time. Dr. Everardo All. Intol metform   Elevated liver enzymes    Hepatic steatosis    82021 ultrasound   Obesity, Class III, BMI 40-49.9 (morbid  obesity) (HCC)    Seborrheic dermatitis of scalp    Tinea versicolor     Past Surgical History Past Surgical History:  Procedure Laterality Date   TOENAIL EXCISION  2012   Ingrown    Family History family history includes Alcohol abuse in his paternal grandmother; Depression in his father; Diabetes in his father, maternal grandfather, maternal grandmother, mother, paternal grandfather, and paternal grandmother; High Cholesterol in his father and mother.  Social History Social History   Socioeconomic History   Marital status: Married    Spouse name: Not on file   Number of children: Not on file   Years of education: Not on file   Highest education level: Associate degree: academic program  Occupational History   Occupation: Doctor, general practice, EMT  Tobacco Use   Smoking status: Never   Smokeless tobacco: Never  Substance and Sexual Activity   Alcohol use: Never   Drug use: Never   Sexual activity: Yes    Partners: Female    Birth control/protection: None  Other Topics Concern   Not on file  Social History Narrative   Longtime girlfriend, 2 y/o daughter.   Relocated to  from [place], [place] [address].  Orig from [place].   College: Assoc degree in [place].   Occup: Data entry, asset recovery.   No T/A/Ds   Social Drivers of Health   Financial Resource Strain: Low Risk  (03/07/2023)   Overall Financial Resource Strain (CARDIA)    Difficulty of Paying Living Expenses: Not hard at all  Food Insecurity: No Food Insecurity (03/07/2023)   Hunger Vital Sign    Worried About Running Out of Food in the Last Year: Never true    Ran Out of Food in the Last Year: Never true  Transportation Needs: No Transportation Needs (03/07/2023)   PRAPARE - Administrator, Civil Service (Medical): No    Lack of Transportation (Non-Medical): No  Physical Activity: Sufficiently Active (03/07/2023)   Exercise Vital Sign    Days of Exercise per Week: 5 days    Minutes of Exercise per  Session: 40 min  Stress: Stress Concern Present (03/07/2023)   [employer] of Occupational Health - Occupational Stress Questionnaire    Feeling of Stress : To some extent  Social Connections: Moderately Integrated (03/07/2023)   Social Connection and Isolation Panel [NHANES]    Frequency of Communication with Friends and Family: More than three times a week    Frequency of Social Gatherings with Friends and Family: Three times a week    Attends Religious Services: 1 to 4 times per year    Active Member of Clubs or Organizations:  No    Attends Club or Organization Meetings: Not on file    Marital Status: Married  Intimate Partner Violence: Unknown (06/22/2021)   Received from [facility]   HITS    Physically Hurt: Not on file    Insult or Talk Down To: Not on file    Threaten Physical Harm: Not on file    Scream or Curse: Not on file    Lab Results  Component Value Date   HGBA1C 5.0 08/16/2022   HGBA1C 5.1 02/01/2022   HGBA1C 5.2 10/19/2021   Lab Results  Component Value Date   CHOL 164 08/16/2022   Lab Results  Component Value Date   HDL 38.90 (L) 08/16/2022   Lab Results  Component Value Date   LDLCALC 102 (H) 08/16/2022   Lab Results  Component Value Date   TRIG 116.0 08/16/2022   Lab Results  Component Value Date   CHOLHDL 4 08/16/2022   Lab Results  Component Value Date   CREATININE 1.02 03/07/2023   Lab Results  Component Value Date   GFR 101.18 08/16/2022   Lab Results  Component Value Date   MICROALBUR 1.0 08/16/2022      Component Value Date/Time   NA 142 03/07/2023 1510   NA 138 10/07/2019 1533   K 4.0 03/07/2023 1510   CL 106 03/07/2023 1510   CO2 24 03/07/2023 1510   GLUCOSE 91 03/07/2023 1510   BUN 13 03/07/2023 1510   BUN 9 10/07/2019 1533   CREATININE 1.02 03/07/2023 1510   CALCIUM 9.9 03/07/2023 1510   PROT 8.6 (H) 03/07/2023 1510   PROT 7.9 10/07/2019 1533   ALBUMIN 4.6 08/16/2022 1149   ALBUMIN 4.3  10/07/2019 1533   AST 14 03/07/2023 1510   AST 61 (H) 07/14/2019 0759   ALT 16 03/07/2023 1510   ALT 105 (H) 07/14/2019 0759   ALKPHOS 64 08/16/2022 1149   BILITOT 0.6 03/07/2023 1510   BILITOT 0.8 10/07/2019 1533   BILITOT 0.4 07/14/2019 0759   GFRNONAA 115 10/07/2019 1533   GFRNONAA >60 07/14/2019 0759   GFRAA 133 10/07/2019 1533   GFRAA >60 07/14/2019 0759      Latest Ref Rng & Units 03/07/2023    3:10 PM 08/16/2022   11:49 AM 02/01/2022   11:43 AM  BMP  Glucose 65 - 99 mg/dL 91  94  161   BUN 7 - 25 mg/dL 13  11  11    Creatinine 0.60 - 1.26 mg/dL 0.96  0.45  4.09   BUN/Creat Ratio 6 - 22 (calc) SEE NOTE:     Sodium 135 - 146 mmol/L 142  137  138   Potassium 3.5 - 5.3 mmol/L 4.0  3.6  3.6   Chloride 98 - 110 mmol/L 106  103  104   CO2 20 - 32 mmol/L 24  26  28    Calcium 8.6 - 10.3 mg/dL 9.9  9.4  9.4        Component Value Date/Time   WBC 9.3 03/07/2023 1510   RBC 5.23 03/07/2023 1510   HGB 16.6 03/07/2023 1510   HGB 15.0 07/14/2019 0759   HCT 47.9 03/07/2023 1510   PLT 485 (H) 03/07/2023 1510   PLT 327 07/14/2019 0759   MCV 91.6 03/07/2023 1510   MCH 31.7 03/07/2023 1510   MCHC 34.7 03/07/2023 1510   RDW 11.9 03/07/2023 1510   LYMPHSABS 2.7 07/14/2019 0759   MONOABS 0.6 07/14/2019 0759   EOSABS 0.4 07/14/2019 0759  BASOSABS 0.1 07/14/2019 0759     Parts of this note may have been dictated using voice recognition software. There may be variances in spelling and vocabulary which are unintentional. Not all errors are proofread. Please notify the Thereasa Parkin if any discrepancies are noted or if the meaning of any statement is not clear.

## 2023-06-25 ENCOUNTER — Other Ambulatory Visit: Payer: Self-pay | Admitting: "Endocrinology

## 2023-06-26 MED ORDER — PHENTERMINE HCL 30 MG PO CAPS: 30.0000 mg | ORAL_CAPSULE | ORAL | 0 refills | Status: DC

## 2023-06-26 NOTE — Addendum Note (Signed)
 Addended by: Altamese Hoxie on: 06/26/2023 04:32 PM   Modules accepted: Orders

## 2023-06-27 ENCOUNTER — Other Ambulatory Visit: Payer: Self-pay

## 2023-06-27 ENCOUNTER — Other Ambulatory Visit: Payer: Self-pay | Admitting: "Endocrinology

## 2023-06-27 MED ORDER — PHENTERMINE HCL 30 MG PO CAPS: 30.0000 mg | ORAL_CAPSULE | ORAL | 1 refills | Status: DC

## 2023-07-07 ENCOUNTER — Other Ambulatory Visit (HOSPITAL_COMMUNITY): Payer: Self-pay

## 2023-07-07 ENCOUNTER — Other Ambulatory Visit: Payer: Self-pay

## 2023-07-08 ENCOUNTER — Telehealth: Payer: Self-pay | Admitting: "Endocrinology

## 2023-07-08 ENCOUNTER — Encounter: Payer: Self-pay | Admitting: "Endocrinology

## 2023-07-08 NOTE — Telephone Encounter (Signed)
 Patient called to advise that the RX for Phentermine  has not be received by the [facility] in [place] on [address] -   Phentermine  RX currently shows in Class: Print  Patient requesting that RX be sent

## 2023-07-08 NOTE — Telephone Encounter (Signed)
 See other encounter.

## 2023-07-09 ENCOUNTER — Other Ambulatory Visit: Payer: Self-pay

## 2023-07-09 ENCOUNTER — Other Ambulatory Visit: Payer: Self-pay | Admitting: "Endocrinology

## 2023-07-09 ENCOUNTER — Encounter: Payer: Self-pay | Admitting: "Endocrinology

## 2023-07-09 MED ORDER — PHENTERMINE HCL 30 MG PO CAPS: 30.0000 mg | ORAL_CAPSULE | ORAL | 0 refills | Status: AC

## 2023-09-04 ENCOUNTER — Other Ambulatory Visit: Payer: Self-pay | Admitting: Internal Medicine

## 2023-09-04 DIAGNOSIS — R7303 Prediabetes: Secondary | ICD-10-CM

## 2023-09-05 ENCOUNTER — Other Ambulatory Visit: Payer: Self-pay

## 2023-09-05 DIAGNOSIS — R7303 Prediabetes: Secondary | ICD-10-CM

## 2023-09-05 MED ORDER — TIRZEPATIDE 15 MG/0.5ML ~~LOC~~ SOAJ: 15.0000 mg | SUBCUTANEOUS | 3 refills | Status: AC

## 2024-01-08 ENCOUNTER — Other Ambulatory Visit: Payer: Self-pay | Admitting: "Endocrinology

## 2024-03-25 ENCOUNTER — Encounter: Payer: Self-pay | Admitting: "Endocrinology

## 2024-03-25 ENCOUNTER — Telehealth: Admitting: "Endocrinology

## 2024-03-25 VITALS — Ht 68.0 in | Wt 224.0 lb

## 2024-03-25 DIAGNOSIS — E785 Hyperlipidemia, unspecified: Secondary | ICD-10-CM | POA: Diagnosis not present

## 2024-03-25 DIAGNOSIS — E66811 Obesity, class 1: Secondary | ICD-10-CM | POA: Diagnosis not present

## 2024-03-25 DIAGNOSIS — Z6832 Body mass index (BMI) 32.0-32.9, adult: Secondary | ICD-10-CM

## 2024-03-25 DIAGNOSIS — E6609 Other obesity due to excess calories: Secondary | ICD-10-CM

## 2024-03-25 NOTE — Progress Notes (Signed)
 The patient reports they are currently: [place]. I spent 7-8 minutes on the video with the patient on the date of service. I spent an additional 10 minutes on pre- and post-visit activities on the date of service.   The patient was physically located in [place]  or a state in which I am permitted to provide care. The patient and/or parent/guardian understood that s/he may incur co-pays and cost sharing, and agreed to the telemedicine visit. The visit was reasonable and appropriate under the circumstances given the patient's presentation at the time.  The patient and/or parent/guardian has been advised of the potential risks and limitations of this mode of treatment (including, but not limited to, the absence of in-person examination) and has agreed to be treated using telemedicine. The patient's/patient's family's questions regarding telemedicine have been answered.   The patient and/or parent/guardian has also been advised to contact their provider's office for worsening conditions, and seek emergency medical treatment and/or call 911 if the patient deems either necessary.    Outpatient Endocrinology Note Obadiah Birmingham, MD  03/25/2024   Gabino Hun 03/15/1986 [id number]  Referring Provider: Candise Aleene DEL, MD Primary Care Provider: Candise Aleene DEL, MD Reason for consultation: Subjective   Assessment & Plan  Diagnoses and all orders for this visit:  Class 1 obesity due to excess calories with serious comorbidity and body mass index (BMI) of 32.0 to 32.9 in adult    Prediabetes, diagnosed in 2021 Hba1c goal less than 7.0, current Hba1c is Lab Results  Component Value Date   HGBA1C 5.0 08/16/2022   HGBA1C 5.1 02/01/2022   HGBA1C 5.2 10/19/2021    Will recommend the following: Mounjaro  15 mg weekly-lost 50 lbs with it, from plateau to weight gain now 01/2023 Started combination of phentermine  30 mg every day with topiramate  25 mg every day - lost 12 lbs, denies  palpitations, insomnia, confusion/any other S/E   06/23/23 Weighs 207, increase topiramate  to 50 mg every day, continue phentermine  30 mg every day and Mounjaro  15 mg weekly Previously, Started phentermine  30 mg qd to help with weight loss on 09/19/2021 (weight then was 258 lbs today with work vest on). Stopped after 1 mo after experienced no weight loss  Qsymia  3.75/23mg  not covered  03/25/2024 Pt interested in weight loss Discussed lifestyle changes at length Maintain healthy lifestyle including 1500 Cal/day, 30 min of activity/day, avoiding refined/processed/outside food 20 minutes physical activity per day, in continuum or interruptedly through the day  Goals: less than 60 grams of carbohydrate/meal, 10,0000 steps a day and weight loss of 0.5-1 lb/ wk  Sleep 7-9 hours/day, adapt good sleep hygiene Adapt de-stressing and relaxation techniques to prevent stress induced weight gain Avoid/switch medications that lead to weight gain by discussing with the prescribing physician   No known contraindications to any of above medications No contraindications, discussed side effects   Hyperlipidemia -Last LDL off goal: 102 -on rosuvastatin  5 mg QD: non compliant, discussed compliance previously  -Follow low fat diet and exercise   -Blood pressure goal <140/90 - Microalbumin/creatinine at goal < 30 - not on ACE/ARB  -diet changes including salt restriction -limit eating outside -counseled BP targets per standards of diabetes care -Uncontrolled blood pressure can lead to retinopathy, nephropathy and cardiovascular and atherosclerotic heart disease  Reviewed and counseled on: -A1C target -Blood sugar targets -Complications of uncontrolled diabetes  -Checking blood sugar before meals and bedtime and bring log next visit -All medications with mechanism of action and side effects -Hypoglycemia management: rule  of 15's, Glucagon Emergency Kit and medical alert ID -low-carb low-fat plate-method  diet -At least 20 minutes of physical activity per day -Annual dilated retinal eye exam and foot exam -compliance and follow up needs -follow up as scheduled or earlier if problem gets worse  Call if blood sugar is less than 70 or consistently above 250    Take a 15 gm snack of carbohydrate at bedtime before you go to sleep if your blood sugar is less than 100.    If you are going to fast after midnight for a test or procedure, ask your physician for instructions on how to reduce/decrease your insulin dose.    Call if blood sugar is less than 70 or consistently above 250  -Treating a low sugar by rule of 15  (15 gms of sugar every 15 min until sugar is more than 70) If you feel your sugar is low, test your sugar to be sure If your sugar is low (less than 70), then take 15 grams of a fast acting Carbohydrate (3-4 glucose tablets or glucose gel or 4 ounces of juice or regular soda) Recheck your sugar 15 min after treating low to make sure it is more than 70 If sugar is still less than 70, treat again with 15 grams of carbohydrate          Don't drive the hour of hypoglycemia  If unconscious/unable to eat or drink by mouth, use glucagon injection or nasal spray baqsimi and call 911. Can repeat again in 15 min if still unconscious.  Return in about 3 months (around 06/23/2024) for visit and 8 am labs before next visit.  I have reviewed current medications, nurse's notes, allergies, vital signs, past medical and surgical history, family medical history, and social history for this encounter. Counseled patient on symptoms, examination findings, lab findings, imaging results, treatment decisions and monitoring and prognosis. The patient understood the recommendations and agrees with the treatment plan. All questions regarding treatment plan were fully answered.  Obadiah Birmingham, MD  03/25/2024    History of Present Illness Shafer Pereira is a 39 y.o. year old male who presents for follow up  on prediabetes, diagnosed in 2021.  Diabetes education +  Home diabetes regimen: Mounjaro  15 mg weekly  Qsymia  is not covered  Phenteramine 15 mg every day for 3 weeks without weight benefit/side effects  Tried phentermine  30 mg every day for 30 days without effect, self discontinued  01/2023 Started combination of phentermine  30 mg every day with topiramate  25 mg every day - lost 12 lbs, denies palpitations, insomnia, confusion/any other S/E   06/23/23 Weighs 207   Previous history: His highest fasting glucose has been 136 as of 06/2019 and highest A1c 6.3 Non-insulin hypoglycemic drugs previously used: Trulicity , Invokana , Victoza , metformin  Did not benefit from metformin  for weight loss  Side effects from medications: Trulicity  caused nausea and diarrhea  COMPLICATIONS -  MI/Stroke -  retinopathy -  neuropathy -  nephropathy  BLOOD SUGAR DATA Doesn't check BG at home  Physical Exam  Ht 5' 8 (1.727 m)   Wt 224 lb (101.6 kg)   BMI 34.06 kg/m    Constitutional: well developed, well nourished Head: normocephalic, atraumatic Eyes: sclera anicteric, no redness Neck: supple Lungs: normal respiratory effort Neurology: alert and oriented Skin: dry, no appreciable rashes Musculoskeletal: no appreciable defects Psychiatric: normal mood and affect   Current Medications Patient's Medications  New Prescriptions   No medications on file  Previous Medications  GLUCOSE BLOOD (ONETOUCH VERIO) TEST STRIP    1 each by Other route daily. And lancets 1/day   KETOCONAZOLE (NIZORAL) 2 % SHAMPOO       NYSTATIN CREAM (MYCOSTATIN)       PHENTERMINE  30 MG CAPSULE    Take 1 capsule (30 mg total) by mouth every morning.   ROSUVASTATIN  (CRESTOR ) 5 MG TABLET    Take 1 tablet (5 mg total) by mouth daily.   TIRZEPATIDE  (MOUNJARO ) 15 MG/0.5ML PEN    Inject 15 mg into the skin once a week.   TOPIRAMATE  (TOPAMAX ) 25 MG TABLET    Take 1 tablet (25 mg total) by mouth daily.  Modified Medications    No medications on file  Discontinued Medications   No medications on file    Allergies No Active Allergies  Past Medical History Past Medical History:  Diagnosis Date   Borderline diabetes    2021 by fasting gluc criteria, Hba1c 6.3% at the time. Dr. Kassie. Intol metform   Elevated liver enzymes    Hepatic steatosis    82021 ultrasound   Obesity, Class III, BMI 40-49.9 (morbid obesity) (HCC)    Seborrheic dermatitis of scalp    Tinea versicolor     Past Surgical History Past Surgical History:  Procedure Laterality Date   TOENAIL EXCISION  2012   Ingrown    Family History family history includes Alcohol abuse in his paternal grandmother; Depression in his father; Diabetes in his father, maternal grandfather, maternal grandmother, mother, paternal grandfather, and paternal grandmother; High Cholesterol in his father and mother.  Social History Social History   Socioeconomic History   Marital status: Married    Spouse name: Not on file   Number of children: Not on file   Years of education: Not on file   Highest education level: Associate degree: academic program  Occupational History   Occupation: Doctor, General Practice, EMT  Tobacco Use   Smoking status: Never   Smokeless tobacco: Never  Substance and Sexual Activity   Alcohol use: Never   Drug use: Never   Sexual activity: Yes    Partners: Female    Birth control/protection: None  Other Topics Concern   Not on file  Social History Narrative   Longtime girlfriend, 2 y/o daughter.   Relocated to [place] from [place], [place] [address].  Orig from [place].   College: Assoc degree in [place].   Occup: Data entry, asset recovery.   No T/A/Ds   Social Drivers of Health   Tobacco Use: Low Risk (03/25/2024)   Patient History    Smoking Tobacco Use: Never    Smokeless Tobacco Use: Never    Passive Exposure: Not on file  Financial Resource Strain: Low Risk (03/07/2023)   Overall Financial Resource Strain (CARDIA)    Difficulty of  Paying Living Expenses: Not hard at all  Food Insecurity: No Food Insecurity (03/07/2023)   Hunger Vital Sign    Worried About Running Out of Food in the Last Year: Never true    Ran Out of Food in the Last Year: Never true  Transportation Needs: No Transportation Needs (03/07/2023)   PRAPARE - Administrator, Civil Service (Medical): No    Lack of Transportation (Non-Medical): No  Physical Activity: Sufficiently Active (03/07/2023)   Exercise Vital Sign    Days of Exercise per Week: 5 days    Minutes of Exercise per Session: 40 min  Stress: Stress Concern Present (03/07/2023)   [employer] of Occupational Health -  Occupational Stress Questionnaire    Feeling of Stress : To some extent  Social Connections: Moderately Integrated (03/07/2023)   Social Connection and Isolation Panel    Frequency of Communication with Friends and Family: More than three times a week    Frequency of Social Gatherings with Friends and Family: Three times a week    Attends Religious Services: 1 to 4 times per year    Active Member of Clubs or Organizations: No    Attends Banker Meetings: Not on file    Marital Status: Married  Intimate Partner Violence: Not At Risk (03/01/2024)   Received from [facility]   HITS    Over the last 12 months how often did your partner scream or curse at you?: Never    Over the last 12 months how often did your partner threaten you with physical harm?: Never    Over the last 12 months how often did your partner insult you or talk down to you?: Never    Over the last 12 months how often did your partner physically hurt you?: Never  Depression (PHQ2-9): Low Risk (03/07/2023)   Depression (PHQ2-9)    PHQ-2 Score: 0  Alcohol Screen: Not on file  Housing: Low Risk (03/07/2023)   Housing Stability Vital Sign    Unable to Pay for Housing in the Last Year: No    Number of Times Moved in the Last Year: 0    Homeless in the Last Year: No   Utilities: Not on file  Health Literacy: Not on file    Lab Results  Component Value Date   HGBA1C 5.0 08/16/2022   HGBA1C 5.1 02/01/2022   HGBA1C 5.2 10/19/2021   Lab Results  Component Value Date   CHOL 164 08/16/2022   Lab Results  Component Value Date   HDL 38.90 (L) 08/16/2022   Lab Results  Component Value Date   LDLCALC 102 (H) 08/16/2022   Lab Results  Component Value Date   TRIG 116.0 08/16/2022   Lab Results  Component Value Date   CHOLHDL 4 08/16/2022   Lab Results  Component Value Date   CREATININE 1.02 03/07/2023   Lab Results  Component Value Date   GFR 101.18 08/16/2022   No results found for: MACKEY CURRENT     Component Value Date/Time   NA 142 03/07/2023 1510   NA 138 10/07/2019 1533   K 4.0 03/07/2023 1510   CL 106 03/07/2023 1510   CO2 24 03/07/2023 1510   GLUCOSE 91 03/07/2023 1510   BUN 13 03/07/2023 1510   BUN 9 10/07/2019 1533   CREATININE 1.02 03/07/2023 1510   CALCIUM  9.9 03/07/2023 1510   PROT 8.6 (H) 03/07/2023 1510   PROT 7.9 10/07/2019 1533   ALBUMIN 4.6 08/16/2022 1149   ALBUMIN 4.3 10/07/2019 1533   AST 14 03/07/2023 1510   AST 61 (H) 07/14/2019 0759   ALT 16 03/07/2023 1510   ALT 105 (H) 07/14/2019 0759   ALKPHOS 64 08/16/2022 1149   BILITOT 0.6 03/07/2023 1510   BILITOT 0.8 10/07/2019 1533   BILITOT 0.4 07/14/2019 0759   GFRNONAA 115 10/07/2019 1533   GFRNONAA >60 07/14/2019 0759   GFRAA 133 10/07/2019 1533   GFRAA >60 07/14/2019 0759      Latest Ref Rng & Units 03/07/2023    3:10 PM 08/16/2022   11:49 AM 02/01/2022   11:43 AM  BMP  Glucose 65 - 99 mg/dL 91  94  898  BUN 7 - 25 mg/dL 13  11  11    Creatinine 0.60 - 1.26 mg/dL 8.97  9.03  9.08   BUN/Creat Ratio 6 - 22 (calc) SEE NOTE:     Sodium 135 - 146 mmol/L 142  137  138   Potassium 3.5 - 5.3 mmol/L 4.0  3.6  3.6   Chloride 98 - 110 mmol/L 106  103  104   CO2 20 - 32 mmol/L 24  26  28    Calcium  8.6 - 10.3 mg/dL 9.9  9.4  9.4         Component Value Date/Time   WBC 9.3 03/07/2023 1510   RBC 5.23 03/07/2023 1510   HGB 16.6 03/07/2023 1510   HGB 15.0 07/14/2019 0759   HCT 47.9 03/07/2023 1510   PLT 485 (H) 03/07/2023 1510   PLT 327 07/14/2019 0759   MCV 91.6 03/07/2023 1510   MCH 31.7 03/07/2023 1510   MCHC 34.7 03/07/2023 1510   RDW 11.9 03/07/2023 1510   LYMPHSABS 2.7 07/14/2019 0759   MONOABS 0.6 07/14/2019 0759   EOSABS 0.4 07/14/2019 0759   BASOSABS 0.1 07/14/2019 0759     Parts of this note may have been dictated using voice recognition software. There may be variances in spelling and vocabulary which are unintentional. Not all errors are proofread. Please notify the dino if any discrepancies are noted or if the meaning of any statement is not clear.

## 2024-03-25 NOTE — Patient Instructions (Addendum)
 Calorie counting App 1) [employer] 2) Calorie King   Pt interested in weight loss Discussed lifestyle changes, medical management as well as bariatric surgery Maintain healthy lifestyle including 1200 Cal/day, 30 min of activity/day, avoiding refined/processed/outside food 20 minutes physical activity per day, in continuum or interruptedly through the day  Goals: less than 60 grams of carbohydrate/meal, 1200-1500 Cal/day, 10,0000 steps a day and weight loss of 0.5-1 lb/ wk  Sleep 7-9 hours/day, adapt good sleep hygiene Adapt de-stressing and relaxation techniques to prevent stress induced weight gain Avoid/switch medications that lead to weight gain by discussing with the prescribing physician
